# Patient Record
Sex: Female | Born: 1955 | Race: White | Hispanic: No | Smoking: Never smoker
Health system: Southern US, Community
[De-identification: ages and names within clinical notes are randomized; demographics above are authoritative.]

## PROBLEM LIST (undated history)

## (undated) DIAGNOSIS — M1990 Unspecified osteoarthritis, unspecified site: Secondary | ICD-10-CM

## (undated) DIAGNOSIS — K219 Gastro-esophageal reflux disease without esophagitis: Secondary | ICD-10-CM

## (undated) DIAGNOSIS — G4730 Sleep apnea, unspecified: Secondary | ICD-10-CM

## (undated) DIAGNOSIS — M797 Fibromyalgia: Secondary | ICD-10-CM

## (undated) DIAGNOSIS — I10 Essential (primary) hypertension: Secondary | ICD-10-CM

## (undated) DIAGNOSIS — F419 Anxiety disorder, unspecified: Secondary | ICD-10-CM

## (undated) DIAGNOSIS — R42 Dizziness and giddiness: Secondary | ICD-10-CM

## (undated) DIAGNOSIS — R519 Headache, unspecified: Secondary | ICD-10-CM

## (undated) DIAGNOSIS — R51 Headache: Secondary | ICD-10-CM

## (undated) DIAGNOSIS — D649 Anemia, unspecified: Secondary | ICD-10-CM

## (undated) DIAGNOSIS — R7303 Prediabetes: Secondary | ICD-10-CM

## (undated) DIAGNOSIS — F329 Major depressive disorder, single episode, unspecified: Secondary | ICD-10-CM

## (undated) DIAGNOSIS — I493 Ventricular premature depolarization: Secondary | ICD-10-CM

## (undated) DIAGNOSIS — F32A Depression, unspecified: Secondary | ICD-10-CM

## (undated) DIAGNOSIS — G250 Essential tremor: Secondary | ICD-10-CM

## (undated) HISTORY — PX: ROTATOR CUFF REPAIR: SHX139

## (undated) HISTORY — PX: OTHER SURGICAL HISTORY: SHX169

## (undated) HISTORY — PX: ELBOW SURGERY: SHX618

## (undated) HISTORY — PX: CARPAL TUNNEL RELEASE: SHX101

## (undated) HISTORY — PX: KNEE SURGERY: SHX244

---

## 1997-06-12 ENCOUNTER — Other Ambulatory Visit: Admission: RE | Admit: 1997-06-12 | Discharge: 1997-06-12 | Payer: Self-pay | Admitting: Gynecology

## 1997-06-15 ENCOUNTER — Ambulatory Visit (HOSPITAL_COMMUNITY): Admission: RE | Admit: 1997-06-15 | Discharge: 1997-06-15 | Payer: Self-pay | Admitting: Gynecology

## 1997-09-01 ENCOUNTER — Emergency Department (HOSPITAL_COMMUNITY): Admission: EM | Admit: 1997-09-01 | Discharge: 1997-09-01 | Payer: Self-pay | Admitting: Emergency Medicine

## 1998-08-20 ENCOUNTER — Other Ambulatory Visit: Admission: RE | Admit: 1998-08-20 | Discharge: 1998-08-20 | Payer: Self-pay | Admitting: Gynecology

## 1998-09-11 ENCOUNTER — Ambulatory Visit (HOSPITAL_COMMUNITY): Admission: RE | Admit: 1998-09-11 | Discharge: 1998-09-11 | Payer: Self-pay | Admitting: Specialist

## 1998-09-11 ENCOUNTER — Encounter: Payer: Self-pay | Admitting: Specialist

## 1998-09-23 ENCOUNTER — Encounter: Payer: Self-pay | Admitting: Specialist

## 1998-09-23 ENCOUNTER — Ambulatory Visit (HOSPITAL_COMMUNITY): Admission: RE | Admit: 1998-09-23 | Discharge: 1998-09-23 | Payer: Self-pay | Admitting: Specialist

## 1998-10-08 ENCOUNTER — Encounter: Admission: RE | Admit: 1998-10-08 | Discharge: 1999-01-06 | Payer: Self-pay | Admitting: Anesthesiology

## 1998-12-02 ENCOUNTER — Ambulatory Visit (HOSPITAL_COMMUNITY): Admission: RE | Admit: 1998-12-02 | Discharge: 1998-12-02 | Payer: Self-pay | Admitting: Neurosurgery

## 1998-12-17 ENCOUNTER — Encounter: Payer: Self-pay | Admitting: Neurosurgery

## 1998-12-17 ENCOUNTER — Ambulatory Visit (HOSPITAL_COMMUNITY): Admission: RE | Admit: 1998-12-17 | Discharge: 1998-12-17 | Payer: Self-pay | Admitting: Neurosurgery

## 1998-12-31 ENCOUNTER — Encounter: Payer: Self-pay | Admitting: Neurosurgery

## 1998-12-31 ENCOUNTER — Ambulatory Visit (HOSPITAL_COMMUNITY): Admission: RE | Admit: 1998-12-31 | Discharge: 1998-12-31 | Payer: Self-pay | Admitting: Neurosurgery

## 1999-10-20 ENCOUNTER — Other Ambulatory Visit: Admission: RE | Admit: 1999-10-20 | Discharge: 1999-10-20 | Payer: Self-pay | Admitting: Gynecology

## 2000-11-10 ENCOUNTER — Other Ambulatory Visit: Admission: RE | Admit: 2000-11-10 | Discharge: 2000-11-10 | Payer: Self-pay | Admitting: Gynecology

## 2001-01-20 ENCOUNTER — Ambulatory Visit (HOSPITAL_COMMUNITY): Admission: RE | Admit: 2001-01-20 | Discharge: 2001-01-20 | Payer: Self-pay | Admitting: Neurosurgery

## 2001-01-20 ENCOUNTER — Encounter: Payer: Self-pay | Admitting: Neurosurgery

## 2002-01-27 ENCOUNTER — Other Ambulatory Visit: Admission: RE | Admit: 2002-01-27 | Discharge: 2002-01-27 | Payer: Self-pay | Admitting: *Deleted

## 2002-11-28 ENCOUNTER — Emergency Department (HOSPITAL_COMMUNITY): Admission: EM | Admit: 2002-11-28 | Discharge: 2002-11-28 | Payer: Self-pay | Admitting: Emergency Medicine

## 2003-01-06 ENCOUNTER — Emergency Department (HOSPITAL_COMMUNITY): Admission: EM | Admit: 2003-01-06 | Discharge: 2003-01-06 | Payer: Self-pay | Admitting: Emergency Medicine

## 2003-03-11 ENCOUNTER — Emergency Department (HOSPITAL_COMMUNITY): Admission: EM | Admit: 2003-03-11 | Discharge: 2003-03-11 | Payer: Self-pay | Admitting: Emergency Medicine

## 2003-05-16 ENCOUNTER — Ambulatory Visit (HOSPITAL_COMMUNITY): Admission: RE | Admit: 2003-05-16 | Discharge: 2003-05-16 | Payer: Self-pay | Admitting: Family Medicine

## 2003-05-31 ENCOUNTER — Encounter: Admission: RE | Admit: 2003-05-31 | Discharge: 2003-05-31 | Payer: Self-pay | Admitting: Family Medicine

## 2003-06-01 ENCOUNTER — Ambulatory Visit (HOSPITAL_COMMUNITY): Admission: RE | Admit: 2003-06-01 | Discharge: 2003-06-01 | Payer: Self-pay | Admitting: Family Medicine

## 2003-07-27 ENCOUNTER — Encounter: Admission: RE | Admit: 2003-07-27 | Discharge: 2003-07-27 | Payer: Self-pay | Admitting: Family Medicine

## 2003-10-09 ENCOUNTER — Other Ambulatory Visit: Admission: RE | Admit: 2003-10-09 | Discharge: 2003-10-09 | Payer: Self-pay | Admitting: Family Medicine

## 2004-01-08 ENCOUNTER — Ambulatory Visit (HOSPITAL_COMMUNITY): Admission: RE | Admit: 2004-01-08 | Discharge: 2004-01-08 | Payer: Self-pay | Admitting: Orthopedic Surgery

## 2004-05-29 ENCOUNTER — Encounter: Admission: RE | Admit: 2004-05-29 | Discharge: 2004-05-29 | Payer: Self-pay | Admitting: Family Medicine

## 2004-07-04 ENCOUNTER — Encounter: Admission: RE | Admit: 2004-07-04 | Discharge: 2004-07-04 | Payer: Self-pay | Admitting: Orthopaedic Surgery

## 2004-07-29 ENCOUNTER — Encounter: Admission: RE | Admit: 2004-07-29 | Discharge: 2004-07-29 | Payer: Self-pay | Admitting: Orthopaedic Surgery

## 2004-08-20 ENCOUNTER — Encounter: Admission: RE | Admit: 2004-08-20 | Discharge: 2004-08-20 | Payer: Self-pay | Admitting: Orthopaedic Surgery

## 2004-09-03 ENCOUNTER — Encounter: Admission: RE | Admit: 2004-09-03 | Discharge: 2004-09-03 | Payer: Self-pay | Admitting: Orthopaedic Surgery

## 2004-09-08 ENCOUNTER — Emergency Department (HOSPITAL_COMMUNITY): Admission: EM | Admit: 2004-09-08 | Discharge: 2004-09-09 | Payer: Self-pay | Admitting: Emergency Medicine

## 2004-10-21 ENCOUNTER — Other Ambulatory Visit: Admission: RE | Admit: 2004-10-21 | Discharge: 2004-10-21 | Payer: Self-pay | Admitting: Family Medicine

## 2004-10-28 ENCOUNTER — Inpatient Hospital Stay (HOSPITAL_COMMUNITY): Admission: EM | Admit: 2004-10-28 | Discharge: 2004-10-29 | Payer: Self-pay | Admitting: Emergency Medicine

## 2004-12-18 ENCOUNTER — Emergency Department (HOSPITAL_COMMUNITY): Admission: EM | Admit: 2004-12-18 | Discharge: 2004-12-18 | Payer: Self-pay | Admitting: Emergency Medicine

## 2004-12-28 ENCOUNTER — Encounter: Admission: RE | Admit: 2004-12-28 | Discharge: 2004-12-28 | Payer: Self-pay | Admitting: Orthopaedic Surgery

## 2005-06-29 ENCOUNTER — Encounter: Admission: RE | Admit: 2005-06-29 | Discharge: 2005-06-29 | Payer: Self-pay | Admitting: Family Medicine

## 2005-11-06 ENCOUNTER — Other Ambulatory Visit: Admission: RE | Admit: 2005-11-06 | Discharge: 2005-11-06 | Payer: Self-pay | Admitting: Family Medicine

## 2006-04-09 ENCOUNTER — Ambulatory Visit (HOSPITAL_COMMUNITY): Admission: RE | Admit: 2006-04-09 | Discharge: 2006-04-09 | Payer: Self-pay | Admitting: Neurological Surgery

## 2006-11-05 ENCOUNTER — Ambulatory Visit (HOSPITAL_COMMUNITY): Admission: RE | Admit: 2006-11-05 | Discharge: 2006-11-05 | Payer: Self-pay

## 2007-09-07 ENCOUNTER — Encounter
Admission: RE | Admit: 2007-09-07 | Discharge: 2007-10-04 | Payer: Self-pay | Admitting: Physical Medicine and Rehabilitation

## 2007-10-12 ENCOUNTER — Other Ambulatory Visit: Admission: RE | Admit: 2007-10-12 | Discharge: 2007-10-12 | Payer: Self-pay | Admitting: Family Medicine

## 2007-11-08 ENCOUNTER — Encounter: Admission: RE | Admit: 2007-11-08 | Discharge: 2007-11-08 | Payer: Self-pay | Admitting: Family Medicine

## 2008-05-22 ENCOUNTER — Encounter: Admission: RE | Admit: 2008-05-22 | Discharge: 2008-05-22 | Payer: Self-pay | Admitting: Family Medicine

## 2009-09-10 ENCOUNTER — Other Ambulatory Visit: Admission: RE | Admit: 2009-09-10 | Discharge: 2009-09-10 | Payer: Self-pay | Admitting: Family Medicine

## 2009-10-30 ENCOUNTER — Encounter: Admission: RE | Admit: 2009-10-30 | Discharge: 2009-10-30 | Payer: Self-pay | Admitting: Family Medicine

## 2010-05-30 NOTE — Discharge Summary (Signed)
NAME:  Alicia Klein, Alicia Klein NO.:  [id number]   MEDICAL RECORD NO.:  [id number]          PATIENT TYPE:  INP   LOCATION:  3707                         FACILITY:  [facility]   PHYSICIAN:  Corky Crafts, MDDATE OF BIRTH:  Dec 08, 1955   DATE OF ADMISSION:  10/28/2004  DATE OF DISCHARGE:  10/29/2004                                 DISCHARGE SUMMARY   CHIEF COMPLAINT/REASON FOR ADMISSION:  Alicia Klein is a 55 year old  morbidly obese female patient who initially saw Dr. Eldridge Dace on October 16  because of symptomatic PVCs.  During that visit, a 2D echocardiogram was  ordered to assist LV function.  She had also been complaining of shortness  of breath, and a sleep study had been ordered to rule out sleep apnea.  She  had some problems related to fatigue and feeling bad, which Dr. Eldridge Dace  attributed to depression.  She presented to the office on the date of  admission because of worsening shortness of breath this morning, along with  profound fatigue, to the point where she can barely stand up without feeling  like she is going to pass out.  She is also complaining of increasing  palpitations as well as mild chest tightness.   Vital signs were checked on the patient, and she was found to be profoundly  orthostatic.  Supine blood pressure 120/80 with a standing blood pressure of  82/62.  She was given 1000 cc of fluids in the office without improvement in  her symptoms.  Her potassium had decreased from 3.1 to 2.8 after  administration of IV fluids, so she has subsequently been admitted to the  hospital for volume depletion with associated symptomatic orthostasis and  hypokalemia.   HOSPITAL COURSE:  1.  Symptomatic orthostasis and hypokalemia:  Patient was admitted to the      telemetry unit, where she was started on IV fluids with IV potassium as      well as she was given multiple doses of oral potassium repletion.  By      the morning of October 18, her potassium had  finally increased to 3.4,      sodium 140, BUN 13, creatinine 0.9, magnesium 1.9.  Because of her      history of palpitations, a TSH was checked at 1.859.  Because of her      chest pain, cardiac isoenzymes were checked.  These were negative.  Her      EKG was negative.  Because of the chest pain, a CT of the chest was      checked to rule out PE.  There was no PE, but there was cholelithiasis.      In further discussion with the patient, she has been troubled recently      by increasing indigestion and reflux symptoms.  By the morning of      discharge, the patient was finally able to ambulate without any      dizziness, weakness, or shortness of breath.  Orthostatic vital signs      were checked by 3:00 in the afternoon.  Supine 109/62 with a  heart rate      of 75.  Sitting was 117/66.  Heart rate 77 and standing was 121/71 with      a heart rate of 77.  Because she is still somewhat normotensive, her      Toprol, which is normally used for hypertension as well as suppression      of symptomatic PVCs has been placed on hold.  Because she is still      somewhat hypokalemic, Dr. Eldridge Dace wishes to continue potassium as      previous with follow-up lab work at our office.  The patient is very      troubled by issues related to edema in her legs, noting that she is      morbidly obese in the past.  She has had significant leg edema which has      caused her shoes not to fit, but this was later discovered to be related      to the use of verapamil.  During this time period, the patient was      started on Bumex.  Suspect the Bumex is what contributed highly to the      patient's significant diuresis and subsequent symptomatic hypotension.      Again, Dr. Eldridge Dace is recommending the patient not continue this potent      diuretic and instead use Dyazide diuretic after he re-evaluates her in      the office.  I did talk with the patient about the best way to assess      edema in her.  Again,  she is over 300 pounds, so is unable to use a home      scale.  I have instructed her to make attempts to check for pitting on      her lower extremities by putting her thumb directly the anterior tibia      and mashing it.  If an actual thumb print is left, this may indicate      need for diuretics.  This will further be discussed with her and      additional ways to assess for edema will be discussed between her and      Dr. Eldridge Dace at followup.   FINAL DISCHARGE DIAGNOSES:  1.  Orthostatic hypotension, symptomatic, secondary to volume depletion due      to use of potent loop diuretic.  2.  Palpitations with history of frequent premature ventricular      contractions.  3.  Hypotension.  4.  Hypokalemia,  greatly improved.  5.  Morbid obesity.  6.  Osteoarthritis.  7.  Probable depression.   DISCHARGE MEDICATIONS:  1.  Stop Bumex.  2.  Celexa 40 mg daily.  3.  Toprol XL 50 mg daily.  This is to be held until the patient follows up      with Dr. Eldridge Dace.  4.  Potassium tablets 10 mEq 2 daily.  5.  Hydrochlorothiazide 25 mg tablets p.r.n. edema.  Again, the patient is      not to take this until after she has been seen by Dr. Eldridge Dace at the      office.   DIET:  Heart healthy.   ACTIVITY:  Increase activity slowly.  No driving for at least 24 hours.  Return to work on Monday, November 03, 2004.   FOLLOW-UP APPOINTMENTS:  She is to see Dr. Eldridge Dace on October 25th at 1  p.m.  She is to arrive to the office at 1:30  for a STAT BMET.      Alicia Klein, N.P.    ______________________________  Corky Crafts, MD    ALE/MEDQ  D:  10/29/2004  T:  10/29/2004  Job:  [id number]   cc:   Molly Maduro A. Nicholos Johns, M.D.  Fax: [phone]

## 2010-05-30 NOTE — Op Note (Signed)
NAME:  Alicia Klein, GASIOROWSKI             ACCOUNT NO.:  [id number]   MEDICAL RECORD NO.:  [id number]          PATIENT TYPE:  AMB   LOCATION:  SDS                          FACILITY:  [facility]   PHYSICIAN:  Tia Alert, MD     DATE OF BIRTH:  22-Aug-1955   DATE OF PROCEDURE:  04/09/2006  DATE OF DISCHARGE:                               OPERATIVE REPORT   PREOPERATIVE DIAGNOSES:  Right carpal tunnel syndrome.   POSTOPERATIVE DIAGNOSES:  Right carpal tunnel syndrome.   PROCEDURE:  Right carpal tunnel release.   SURGEON:  Dr. Marikay Alar.   ANESTHESIA:  Loca MAC.   COMPLICATIONS:  None apparent.   INDICATIONS FOR PROCEDURE:  [name] is a 55 year old female who had  severe right hand numbness.  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  This matched  her symptoms.  She tried bracing without significant relief.  Recommended a right carpal tunnel release.  She understood the risks,  benefits and expected outcome and wished to proceed.   DESCRIPTION OF PROCEDURE:  The patient was taken to the operating room  and after conduction of adequate generalized endotracheal anesthesia,  she was placed in the supine position on the operating table.  Her right  anterior cervical region was prepped circumferentially with DuraPrep and  then draped in the usual sterile fashion.  10 cc of local anesthesia was  injected and the small palmar incision was made from the distal wrist  crease into the palm in line with the web space between the 3rd and 4th  digits.  I dissected down through the palmar fat and fascia, coagulated  the fat, placed a self-retaining retractor, identified the transcarpal  ligament and opened it with a 15 blade scalpel until there was about a 3  mm opening, identifying the median nerve below.  I then spread between  the median nerve and the transverse carpal ligament distally into the  palm utilizing a curved hemostat and then  transected the transverse  carpal ligament distally into the palm until the palmar fat was noticed.  I then palpated with a hemostat or mosquito to make sure that I had a  complete transection of the transverse carpal ligament distally into the  palm.  I then did the same procedure, spread in between the nerve and  the ligament more proximally under the wrist crease until the transverse  carpal ligament was completely transected, more proximally into the  wrist.  I then palpated with a curved hemostat to in assure this was  completely transected.  I then identified the nerve, inspected the nerve  and it was looked to be healthy.  I then irrigated with Saline solution  containing Bacitracin. I  found all bleeding points with bipolar cautery  and then closed the palmar fascia with a single 3-0 Vicryl suture,  closed the subcuticular tissues with 3-0 Vicryl sutures and then closed  the skin with interrupted 4-0 Ethilon vertical mattress  sutures.  The hand was then wrapped in Kerlix and an Ace bandage.  Then  the patient  was transported to the recovery room in stable condition.  At the end of the procedure, all sponge, needle and instrument counts  were correct.      Tia Alert, MD  Electronically Signed     DSJ/MEDQ  D:  04/09/2006  T:  04/09/2006  Job:  [id number]

## 2010-10-27 ENCOUNTER — Other Ambulatory Visit: Payer: Self-pay | Admitting: Family Medicine

## 2010-10-27 DIAGNOSIS — Z1231 Encounter for screening mammogram for malignant neoplasm of breast: Secondary | ICD-10-CM

## 2010-11-17 ENCOUNTER — Ambulatory Visit: Payer: Self-pay

## 2010-11-20 ENCOUNTER — Ambulatory Visit
Admission: RE | Admit: 2010-11-20 | Discharge: 2010-11-20 | Disposition: A | Discharge status: Home / Self Care | Payer: Medicare Other | Source: Ambulatory Visit | Attending: Family Medicine | Admitting: Family Medicine

## 2010-11-20 DIAGNOSIS — Z1231 Encounter for screening mammogram for malignant neoplasm of breast: Secondary | ICD-10-CM

## 2011-02-20 ENCOUNTER — Other Ambulatory Visit: Payer: Self-pay | Admitting: Neurology

## 2011-02-20 DIAGNOSIS — G44009 Cluster headache syndrome, unspecified, not intractable: Secondary | ICD-10-CM

## 2011-02-20 DIAGNOSIS — G4730 Sleep apnea, unspecified: Secondary | ICD-10-CM

## 2011-02-20 DIAGNOSIS — R413 Other amnesia: Secondary | ICD-10-CM

## 2011-02-27 ENCOUNTER — Ambulatory Visit
Admission: RE | Admit: 2011-02-27 | Discharge: 2011-02-27 | Disposition: A | Discharge status: Home / Self Care | Payer: Medicare Other | Source: Ambulatory Visit | Attending: Neurology | Admitting: Neurology

## 2011-02-27 DIAGNOSIS — R413 Other amnesia: Secondary | ICD-10-CM

## 2011-02-27 DIAGNOSIS — G4730 Sleep apnea, unspecified: Secondary | ICD-10-CM

## 2011-02-27 DIAGNOSIS — G44009 Cluster headache syndrome, unspecified, not intractable: Secondary | ICD-10-CM

## 2011-10-07 ENCOUNTER — Other Ambulatory Visit: Payer: Self-pay | Admitting: Family Medicine

## 2011-10-07 DIAGNOSIS — Z1231 Encounter for screening mammogram for malignant neoplasm of breast: Secondary | ICD-10-CM

## 2011-11-23 ENCOUNTER — Ambulatory Visit
Admission: RE | Admit: 2011-11-23 | Discharge: 2011-11-23 | Disposition: A | Discharge status: Home / Self Care | Payer: Medicare Other | Source: Ambulatory Visit | Attending: Family Medicine | Admitting: Family Medicine

## 2011-11-23 DIAGNOSIS — Z1231 Encounter for screening mammogram for malignant neoplasm of breast: Secondary | ICD-10-CM

## 2012-04-26 ENCOUNTER — Other Ambulatory Visit: Payer: Self-pay | Admitting: Family Medicine

## 2012-04-26 ENCOUNTER — Other Ambulatory Visit (HOSPITAL_COMMUNITY)
Admission: RE | Admit: 2012-04-26 | Discharge: 2012-04-26 | Disposition: A | Discharge status: Home / Self Care | Payer: Medicare Other | Source: Ambulatory Visit | Attending: Family Medicine | Admitting: Family Medicine

## 2012-04-26 DIAGNOSIS — Z124 Encounter for screening for malignant neoplasm of cervix: Secondary | ICD-10-CM | POA: Insufficient documentation

## 2012-05-11 ENCOUNTER — Ambulatory Visit: Payer: Self-pay | Admitting: Nurse Practitioner

## 2012-05-21 ENCOUNTER — Encounter (HOSPITAL_COMMUNITY): Payer: Self-pay | Admitting: Emergency Medicine

## 2012-05-21 ENCOUNTER — Emergency Department (HOSPITAL_COMMUNITY)
Admission: EM | Admit: 2012-05-21 | Discharge: 2012-05-21 | Disposition: A | Discharge status: Home / Self Care | Payer: Medicare Other | Attending: Emergency Medicine | Admitting: Emergency Medicine

## 2012-05-21 DIAGNOSIS — M545 Low back pain, unspecified: Secondary | ICD-10-CM | POA: Insufficient documentation

## 2012-05-21 DIAGNOSIS — IMO0001 Reserved for inherently not codable concepts without codable children: Secondary | ICD-10-CM | POA: Insufficient documentation

## 2012-05-21 DIAGNOSIS — M129 Arthropathy, unspecified: Secondary | ICD-10-CM | POA: Insufficient documentation

## 2012-05-21 DIAGNOSIS — R209 Unspecified disturbances of skin sensation: Secondary | ICD-10-CM | POA: Insufficient documentation

## 2012-05-21 DIAGNOSIS — M549 Dorsalgia, unspecified: Secondary | ICD-10-CM

## 2012-05-21 DIAGNOSIS — Z79899 Other long term (current) drug therapy: Secondary | ICD-10-CM | POA: Insufficient documentation

## 2012-05-21 DIAGNOSIS — Z880 Allergy status to penicillin: Secondary | ICD-10-CM | POA: Insufficient documentation

## 2012-05-21 DIAGNOSIS — G8929 Other chronic pain: Secondary | ICD-10-CM | POA: Insufficient documentation

## 2012-05-21 DIAGNOSIS — Z7982 Long term (current) use of aspirin: Secondary | ICD-10-CM | POA: Insufficient documentation

## 2012-05-21 HISTORY — DX: Fibromyalgia: M79.7

## 2012-05-21 HISTORY — DX: Unspecified osteoarthritis, unspecified site: M19.90

## 2012-05-21 MED ORDER — HYDROMORPHONE HCL PF 1 MG/ML IJ SOLN
1.0000 mg | Freq: Once | INTRAMUSCULAR | Status: AC
  Administered 2012-05-21: 1 mg via INTRAMUSCULAR
  Filled 2012-05-21: qty 1

## 2012-05-21 MED ORDER — ONDANSETRON 4 MG PO TBDP
8.0000 mg | ORAL_TABLET | Freq: Once | ORAL | Status: AC
  Administered 2012-05-21: 8 mg via ORAL
  Filled 2012-05-21: qty 2

## 2012-05-21 NOTE — ED Notes (Signed)
Pt presents to ED today with c/o acute on chronic back pain. Pt states she took hydrocodone at 4 am and ice on her lower back without any relief. Patient states this pain is worse than her normal back pain. Pt diaphoretic  in triage.

## 2012-05-21 NOTE — ED Provider Notes (Signed)
History     CSN: [id number]  Arrival date & time 05/21/12  1051   First MD Initiated Contact with Patient 05/21/12 1102     Chief Complaint  Patient presents with  . Back Pain   (Consider location/radiation/quality/duration/timing/severity/associated sxs/prior treatment) HPI Comments: Ms. Madore is a 57 year old morbidly obese female with PMH of fibromyalgia and chronic back pain (followed closely at pain clinic) and s/p multiple epidural injections in the past presenting to the ED today for worsening lower back pain x3 weeks.  Ms. Seiple has chronic diffuse body pain with increased back pain.  She is seen at the pain clinic and is on lyrica and vicodin at home which have not provided much relief recently.  She was scheduled to get a RFA SI joint injection in April 2014, however, this was rescheduled by provider for May 2014 and now pushed back even further due to insurance approval issues.  She has not been able to be seen by them for this worsening back pain for the past three weeks.  The pain is 10/10, more right sided, diffuse tenderness but more point tenderness to mid lower back and radiating down right buttock, feels like a deep aching pain, and this morning she said was the worst to the point that she was in tears.  She did not have anyone with her at home so she had to drive herself here, which was extremely painful.  She took 1 tablet of Vicodin this morning to no relief.  Over the course of the past 3 weeks, she has tried ice and heat to the area, lyrica, and taking vicodin up to three times a day with minimal relief.  She denise any recent trauma to the area, accident, fall, twisting motion, urinary complaints, chest pain, shortness of breath, fever, chills, N/V/D, or any weakness.  She reports occasional tingling in her fingers that she says is chronic and as a result of her fibromyalgia.  She does endorse that her legs occasionally give out on her and she trips.  Her last actual fall  was in February 2014.    Of note MRI 12/2004 was significant for scoliosis of the spine convex to the right with the apex at about T-5.  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  Right posterolateral disc herniation at T10-11 that indents the thecal sac but does not grossly affect the cord. This does extend towards the foramen on the right but not grossly into it.      Patient is a 57 y.o. female presenting with back pain. The history is provided by the patient. No language interpreter was used.  Back Pain Location:  Generalized (point tendernss to mid lower spine) Quality:  Aching Pain severity:  Severe Pain is:  Same all the time Onset quality:  Gradual Duration:  3 weeks Timing:  Constant Progression:  Worsening Chronicity:  Chronic Context: not jumping from heights, not lifting heavy objects, not recent injury and not twisting   Context comment:  Trips often Relieved by:  Nothing (mild improvement with opiates) Worsened by:  Movement and sitting Ineffective treatments:  Cold packs, heating pad, bed rest, being still, lying down and narcotics Risk factors: obesity     Past Medical History  Diagnosis Date  . Fibromyalgia   . Arthritis     Past Surgical History  Procedure Laterality Date  . Carpal tunnel release      both  wrists  . Knee surgery      right knee    History reviewed. No pertinent family history.  History  Substance Use Topics  . Smoking status: Not on file  . Smokeless tobacco: Not on file  . Alcohol Use: Not on file    OB History   Grav Para Term Preterm Abortions TAB SAB Ect Mult Living                  Review of Systems  Constitutional: Negative.        Morbidly obese  HENT: Negative.   Eyes: Negative.   Respiratory: Negative.   Cardiovascular: Negative.   Gastrointestinal: Negative.   Endocrine: Negative.   Genitourinary: Negative.   Musculoskeletal:  Positive for back pain.       Chronic back pain  Skin: Negative.   Neurological: Negative.   Hematological: Negative.   Psychiatric/Behavioral: Negative.     Allergies  Sulfa antibiotics; Amoxicillin; Garlic; Onion; and Percocet  Home Medications   Current Outpatient Rx  Name  Route  Sig  Dispense  Refill  . aspirin EC 81 MG tablet   Oral   Take 81 mg by mouth daily.         . B Complex Vitamins (B COMPLEX PO)   Sublingual   Place 1 mL under the tongue daily.          . Cholecalciferol 4000 UNITS CAPS   Oral   Take 1 capsule by mouth at bedtime.          . citalopram (CELEXA) 40 MG tablet   Oral   Take 40 mg by mouth at bedtime.          Marland Kitchen etodolac (LODINE) 500 MG tablet   Oral   Take 500 mg by mouth daily.         . furosemide (LASIX) 80 MG tablet   Oral   Take 80 mg by mouth daily.         Marland Kitchen HYDROcodone-acetaminophen (NORCO/VICODIN) 5-325 MG per tablet   Oral   Take 2-3 tablets by mouth every 6 (six) hours as needed for pain.         . metoprolol succinate (TOPROL-XL) 100 MG 24 hr tablet   Oral   Take 100 mg by mouth daily. Take with or immediately following a meal.         . Multiple Vitamins-Minerals (MULTIVITAMINS THER. W/MINERALS) TABS   Oral   Take 1 tablet by mouth daily.         . potassium chloride (K-DUR) 10 MEQ tablet   Oral   Take 10 mEq by mouth daily.         . pregabalin (LYRICA) 75 MG capsule   Oral   Take 75-150 mg by mouth 2 (two) times daily. 75 mg in morning and 150 mg at night         . ranitidine (ZANTAC) 150 MG tablet   Oral   Take 150 mg by mouth 2 (two) times daily as needed for heartburn (acid reflux).         . Red Yeast Rice 600 MG CAPS   Oral   Take 600 mg by mouth 2 (two) times daily.          Marland Kitchen topiramate (TOPAMAX) 50 MG tablet   Oral   Take 150 mg by mouth at bedtime. Migraines         . baclofen (LIORESAL) 10 MG tablet  Oral   Take 10 mg by mouth 2 (two) times daily as needed  (headache). Limited to 2 episodes per week           BP 137/74  Pulse 59  Temp(Src) 98.2 F (36.8 C) (Oral)  Resp 16  SpO2 96%  Physical Exam  Constitutional: She is oriented to person, place, and time.  Morbidly obese  HENT:  Head: Normocephalic and atraumatic.  Eyes: Conjunctivae and EOM are normal. Pupils are equal, round, and reactive to light.  Neck: Normal range of motion.  Cardiovascular: Normal rate, regular rhythm, normal heart sounds and intact distal pulses.   Distant heart sounds due to body habitus  Pulmonary/Chest: Effort normal and breath sounds normal.  Abdominal: Soft. Bowel sounds are normal. She exhibits no distension. There is no tenderness.  obese  Musculoskeletal: She exhibits tenderness.  Diffuse tenderness to palpation with hx of fibromyalgia.  Limited ability to lift b/l lower extremities due to complaints of lower back pain.  Able to sit up and walk with cane but painful. Point tenderness to deep palpation of mid lower spine at hip line, worse on right than left.    Neurological: She is alert and oriented to person, place, and time. No cranial nerve deficit.  Strength 4/5 equal b/l upper and lower extremities, seems effort dependent.  Sensation grossly intact.   Skin: Skin is warm and dry.  Psychiatric: She has a normal mood and affect. Her behavior is normal. Judgment and thought content normal.    ED Course  Procedures (including critical care time)  Labs Reviewed - No data to display No results found.   1. Chronic back pain     MDM  Ms. Critz is a 57 year old morbidly obese female with fibromyalgia and chronic back pain presenting with worsening lower back pain x3 weeks.    -1mg  IM Dilaudid injection pain control -follow up advised with pain clinic and pcp as soon as possible -d/c home  Case discussed with Dr. Liston Alba, MD 05/22/12 [phone]

## 2012-05-21 NOTE — ED Notes (Signed)
C/o lower back pain x 3 weeks. Denies injury. Has chronic low back pain currently goes to pain clinic to manage. But reports this pain is lower & worse than normal. Pain R>L. States unable to get appt with pain clinic due to insurance problems.

## 2012-05-24 NOTE — ED Provider Notes (Signed)
I saw and evaluated the patient, reviewed the resident's note and I agree with the findings and plan. Pt c/o chronic low back pain. Denies injury. No gu c/o. No fever. Spine nt. No shingles/rash or sts  To area of pain.   Suzi Roots, MD 05/24/12 1059

## 2012-12-07 ENCOUNTER — Other Ambulatory Visit: Payer: Self-pay

## 2012-12-07 DIAGNOSIS — Z1231 Encounter for screening mammogram for malignant neoplasm of breast: Secondary | ICD-10-CM

## 2012-12-31 ENCOUNTER — Encounter (HOSPITAL_COMMUNITY): Payer: Self-pay | Admitting: Emergency Medicine

## 2012-12-31 ENCOUNTER — Emergency Department (HOSPITAL_COMMUNITY)
Admission: EM | Admit: 2012-12-31 | Discharge: 2012-12-31 | Disposition: A | Discharge status: Home / Self Care | Payer: Medicare Other | Attending: Emergency Medicine | Admitting: Emergency Medicine

## 2012-12-31 DIAGNOSIS — Z7982 Long term (current) use of aspirin: Secondary | ICD-10-CM | POA: Insufficient documentation

## 2012-12-31 DIAGNOSIS — R42 Dizziness and giddiness: Secondary | ICD-10-CM | POA: Insufficient documentation

## 2012-12-31 DIAGNOSIS — Z79899 Other long term (current) drug therapy: Secondary | ICD-10-CM | POA: Insufficient documentation

## 2012-12-31 DIAGNOSIS — H81392 Other peripheral vertigo, left ear: Secondary | ICD-10-CM

## 2012-12-31 DIAGNOSIS — M129 Arthropathy, unspecified: Secondary | ICD-10-CM | POA: Insufficient documentation

## 2012-12-31 DIAGNOSIS — H81399 Other peripheral vertigo, unspecified ear: Secondary | ICD-10-CM | POA: Insufficient documentation

## 2012-12-31 DIAGNOSIS — R110 Nausea: Secondary | ICD-10-CM | POA: Insufficient documentation

## 2012-12-31 HISTORY — DX: Dizziness and giddiness: R42

## 2012-12-31 LAB — BASIC METABOLIC PANEL
Calcium: 9.2 mg/dL (ref 8.4–10.5)
GFR calc non Af Amer: 75 mL/min — ABNORMAL LOW (ref >90)
Sodium: 140 mEq/L (ref 135–145)

## 2012-12-31 LAB — CBC WITH DIFFERENTIAL/PLATELET
Basophils Absolute: 0 10*3/uL (ref 0.0–0.1)
Eosinophils Absolute: 0.2 10*3/uL (ref 0.0–0.7)
Eosinophils Relative: 2 % (ref 0–5)
Lymphocytes Relative: 28 % (ref 12–46)
MCH: 30.5 pg (ref 26.0–34.0)
MCV: 90 fL (ref 78.0–100.0)
Platelets: 226 10*3/uL (ref 150–400)
RDW: 13.2 % (ref 11.5–15.5)
WBC: 7.9 10*3/uL (ref 4.0–10.5)

## 2012-12-31 MED ORDER — MECLIZINE HCL 25 MG PO TABS: 25.0000 mg | ORAL_TABLET | Freq: Three times a day (TID) | ORAL | Status: DC | PRN

## 2012-12-31 MED ORDER — PROMETHAZINE HCL 25 MG/ML IJ SOLN
12.5000 mg | Freq: Once | INTRAMUSCULAR | Status: AC
  Administered 2012-12-31: 12.5 mg via INTRAVENOUS
  Filled 2012-12-31 (×2): qty 1

## 2012-12-31 MED ORDER — ONDANSETRON HCL 4 MG PO TABS: 4.0000 mg | ORAL_TABLET | Freq: Four times a day (QID) | ORAL | Status: DC

## 2012-12-31 MED ORDER — SODIUM CHLORIDE 0.9 % IV BOLUS (SEPSIS)
1000.0000 mL | Freq: Once | INTRAVENOUS | Status: AC
  Administered 2012-12-31: 1000 mL via INTRAVENOUS

## 2012-12-31 MED ORDER — MECLIZINE HCL 25 MG PO TABS
25.0000 mg | ORAL_TABLET | Freq: Once | ORAL | Status: AC
  Administered 2012-12-31: 25 mg via ORAL
  Filled 2012-12-31: qty 1

## 2012-12-31 NOTE — ED Notes (Signed)
Bed: WU98 Expected date: 12/31/12 Expected time: 8:39 AM Means of arrival: Ambulance Comments: Vertigo

## 2012-12-31 NOTE — ED Notes (Signed)
She c/o dizziness; and "whenever I move my head I get real nauseated".  She is alert and oriented x 4 with clear speech.  Her skin is pale, warm and dry and she is breathing normally.

## 2012-12-31 NOTE — ED Notes (Signed)
She has attempted to phone a relative--no answer--will assist her with call again in a few minutes.

## 2012-12-31 NOTE — ED Notes (Signed)
She was able to phone the person who is her ride home.  She remains in no distress and denies pain/nausea.

## 2012-12-31 NOTE — ED Notes (Signed)
She states she feels better and is drowsy and in no distress.

## 2012-12-31 NOTE — ED Notes (Signed)
Registration calling for cab for pt

## 2012-12-31 NOTE — ED Provider Notes (Signed)
CSN: [id number]     Arrival date & time 12/31/12  3086 History   First MD Initiated Contact with Patient 12/31/12 0848     Chief Complaint  Patient presents with  . Dizziness    Patient is a 57 y.o. female presenting with dizziness.  Dizziness Quality:  Head spinning and room spinning Severity:  Severe Duration:  3 days Progression:  Worsening (yesterday it seemed a little better but started again when she woke up this am) Chronicity:  New Context: bending over and head movement   Relieved by:  Being still Worsened by:  Movement and turning head (certain movements make it worse) Ineffective treatments:  None tried Associated symptoms: nausea   Associated symptoms: no headaches, no hearing loss, no shortness of breath, no syncope, no tinnitus, no vomiting and no weakness   Associated symptoms comment:  No trouble with coordination or balance  Risk factors: hx of vertigo     Past Medical History  Diagnosis Date  . Fibromyalgia   . Arthritis   . Vertigo    Past Surgical History  Procedure Laterality Date  . Carpal tunnel release      both wrists  . Knee surgery      right knee   History reviewed. No pertinent family history. History  Substance Use Topics  . Smoking status: Never Smoker   . Smokeless tobacco: Not on file  . Alcohol Use: Not on file   OB History   Grav Para Term Preterm Abortions TAB SAB Ect Mult Living                 Review of Systems  HENT: Negative for hearing loss and tinnitus.   Respiratory: Negative for shortness of breath.   Cardiovascular: Negative for syncope.  Gastrointestinal: Positive for nausea. Negative for vomiting.  Neurological: Positive for dizziness. Negative for headaches.  All other systems reviewed and are negative.    Allergies  Sulfa antibiotics; Amoxicillin; Garlic; Onion; and Percocet  Home Medications   Current Outpatient Rx  Name  Route  Sig  Dispense  Refill  . aspirin EC 81 MG tablet   Oral   Take 81 mg  by mouth daily.         . B Complex Vitamins (B COMPLEX PO)   Sublingual   Place 1 mL under the tongue daily.          . baclofen (LIORESAL) 10 MG tablet   Oral   Take 10 mg by mouth 2 (two) times daily as needed (headache). Limited to 2 episodes per week         . cholecalciferol (VITAMIN D) 1000 UNITS tablet   Oral   Take 4,000 Units by mouth every evening.         . citalopram (CELEXA) 40 MG tablet   Oral   Take 40 mg by mouth at bedtime.          Marland Kitchen etodolac (LODINE) 500 MG tablet   Oral   Take 500 mg by mouth daily.         . furosemide (LASIX) 80 MG tablet   Oral   Take 80 mg by mouth daily.         Marland Kitchen HYDROcodone-acetaminophen (NORCO/VICODIN) 5-325 MG per tablet   Oral   Take 2-3 tablets by mouth every 6 (six) hours as needed for pain.         . metoprolol succinate (TOPROL-XL) 100 MG 24 hr tablet   Oral  Take 100 mg by mouth daily. Take with or immediately following a meal.         . Multiple Vitamins-Minerals (MULTIVITAMINS THER. W/MINERALS) TABS   Oral   Take 1 tablet by mouth daily.         . potassium chloride (K-DUR) 10 MEQ tablet   Oral   Take 10 mEq by mouth daily.         . pregabalin (LYRICA) 75 MG capsule   Oral   Take 75-150 mg by mouth 2 (two) times daily. 75 mg in morning and 150 mg at night         . ranitidine (ZANTAC) 150 MG tablet   Oral   Take 150 mg by mouth 2 (two) times daily.          . Red Yeast Rice 600 MG CAPS   Oral   Take 600 mg by mouth 2 (two) times daily.          Marland Kitchen topiramate (TOPAMAX) 50 MG tablet   Oral   Take 150 mg by mouth at bedtime. Migraines         . meclizine (ANTIVERT) 25 MG tablet   Oral   Take 1 tablet (25 mg total) by mouth 3 (three) times daily as needed for dizziness.   30 tablet   0   . ondansetron (ZOFRAN) 4 MG tablet   Oral   Take 1 tablet (4 mg total) by mouth every 6 (six) hours.   12 tablet   0    BP 119/49  Pulse 59  Temp(Src) 97.5 F (36.4 C) (Oral)   Resp 16  SpO2 95% Physical Exam  Nursing note and vitals reviewed. Constitutional: She is oriented to person, place, and time. She appears well-developed and well-nourished. No distress.  HENT:  Head: Normocephalic and atraumatic.  Right Ear: External ear normal.  Left Ear: External ear normal.  Mouth/Throat: Oropharynx is clear and moist.  Eyes: Conjunctivae are normal. Right eye exhibits no discharge. Left eye exhibits no discharge. No scleral icterus.  Neck: Neck supple. No tracheal deviation present.  Cardiovascular: Normal rate, regular rhythm and intact distal pulses.   Pulmonary/Chest: Effort normal and breath sounds normal. No stridor. No respiratory distress. She has no wheezes. She has no rales.  Abdominal: Soft. Bowel sounds are normal. She exhibits no distension. There is no tenderness. There is no rebound and no guarding.  Musculoskeletal: She exhibits no edema and no tenderness.  Neurological: She is alert and oriented to person, place, and time. She has normal strength. No cranial nerve deficit (No facial droop, extraocular movements intact, tongue midline ) or sensory deficit. She exhibits normal muscle tone. She displays no seizure activity. Coordination normal.  No pronator drift bilateral upper extrem, able to hold both legs off bed for 5 seconds, sensation intact in all extremities, no visual field cuts, no left or right sided neglect, normal finger-nose exam bilaterally, no nystagmus noted Vertigo reproduced with hall pike, turning head to left,   Skin: Skin is warm and dry. No rash noted.  Psychiatric: She has a normal mood and affect.    ED Course  Procedures (including critical care time) Labs Review Labs Reviewed  BASIC METABOLIC PANEL - Abnormal; Notable for the following:    Glucose, Bld 124 (*)    GFR calc non Af Amer 75 (*)    GFR calc Af Amer 87 (*)    All other components within normal limits  CBC WITH DIFFERENTIAL  Imaging Review No results  found.  EKG Interpretation    Date/Time:  Saturday December 31 2012 09:39:39 EST Ventricular Rate:  57 PR Interval:  200 QRS Duration: 88 QT Interval:  458 QTC Calculation: 446 R Axis:   13 Text Interpretation:  Sinus rhythm Low voltage, precordial leads No significant change since last tracing Confirmed by Adilson Grafton  MD-J, Salle Brandle ([id number]) on 12/31/2012 9:50:37 AM            MDM   1. Peripheral vertigo, left    Patient has a normal neurologic exam. I doubt a central nervous system cause of her vertigo. The symptoms seem to be related to a peripheral cause. The patient's symptoms are reproduced with turning her head to the left.  She is not anemic or dehydrated.  Patient is able to walk without difficulty.  At this time there does not appear to be any evidence of an acute emergency medical condition and the patient appears stable for discharge with appropriate outpatient follow up. DC home with prescription for meclizine and zofran.  Warning signs and precautions were discussed   Celene Kras, MD 12/31/12 1022

## 2013-01-17 ENCOUNTER — Ambulatory Visit
Admission: RE | Admit: 2013-01-17 | Discharge: 2013-01-17 | Disposition: A | Discharge status: Home / Self Care | Payer: Medicare Other | Source: Ambulatory Visit

## 2013-01-17 DIAGNOSIS — Z1231 Encounter for screening mammogram for malignant neoplasm of breast: Secondary | ICD-10-CM

## 2013-02-14 ENCOUNTER — Other Ambulatory Visit: Payer: Self-pay | Admitting: Specialist

## 2013-02-14 DIAGNOSIS — H9319 Tinnitus, unspecified ear: Secondary | ICD-10-CM

## 2013-02-14 DIAGNOSIS — R42 Dizziness and giddiness: Secondary | ICD-10-CM

## 2013-02-23 ENCOUNTER — Ambulatory Visit
Admission: RE | Admit: 2013-02-23 | Discharge: 2013-02-23 | Disposition: A | Discharge status: Home / Self Care | Payer: Medicare Other | Source: Ambulatory Visit | Attending: Specialist | Admitting: Specialist

## 2013-02-23 DIAGNOSIS — H9319 Tinnitus, unspecified ear: Secondary | ICD-10-CM

## 2013-02-23 DIAGNOSIS — R42 Dizziness and giddiness: Secondary | ICD-10-CM

## 2013-02-23 MED ORDER — GADOBENATE DIMEGLUMINE 529 MG/ML IV SOLN
20.0000 mL | Freq: Once | INTRAVENOUS | Status: AC | PRN
  Administered 2013-02-23: 20 mL via INTRAVENOUS

## 2013-12-15 ENCOUNTER — Other Ambulatory Visit: Payer: Self-pay

## 2013-12-15 DIAGNOSIS — Z1231 Encounter for screening mammogram for malignant neoplasm of breast: Secondary | ICD-10-CM

## 2014-01-19 ENCOUNTER — Ambulatory Visit
Admission: RE | Admit: 2014-01-19 | Discharge: 2014-01-19 | Disposition: A | Discharge status: Home / Self Care | Payer: Medicare Other | Source: Ambulatory Visit

## 2014-01-19 DIAGNOSIS — Z1231 Encounter for screening mammogram for malignant neoplasm of breast: Secondary | ICD-10-CM

## 2014-10-12 ENCOUNTER — Ambulatory Visit: Payer: Medicare Other | Attending: Physical Medicine and Rehabilitation

## 2014-10-12 DIAGNOSIS — R6889 Other general symptoms and signs: Secondary | ICD-10-CM | POA: Diagnosis present

## 2014-10-12 DIAGNOSIS — R293 Abnormal posture: Secondary | ICD-10-CM | POA: Diagnosis not present

## 2014-10-12 DIAGNOSIS — M436 Torticollis: Secondary | ICD-10-CM | POA: Diagnosis present

## 2014-10-12 DIAGNOSIS — R51 Headache: Secondary | ICD-10-CM | POA: Diagnosis present

## 2014-10-12 DIAGNOSIS — M542 Cervicalgia: Secondary | ICD-10-CM

## 2014-10-12 DIAGNOSIS — R519 Headache, unspecified: Secondary | ICD-10-CM

## 2014-10-12 NOTE — Patient Instructions (Signed)
She was instructed and handout issued on sitting posture and use of supports to back and arms. She was instructed in gently scapula retraction and cervical retraction/elongation  3-x/day 2-3 reps each

## 2014-10-12 NOTE — Therapy (Signed)
[facility] [address] [place], [place], [address] Phone: [phone]   Fax:  [phone]  Physical Therapy Evaluation  Patient Details  Name: Alicia Klein MRN: [id number] Date of Birth: 07-10-1955 Referring Provider:  Callie Fielding, MD  Encounter Date: 10/12/2014      PT End of Session - 10/12/14 1139    Visit Number 1   Number of Visits 12   Date for PT Re-Evaluation 11/23/14   Authorization Type Medicare   Authorization - Visit Number 1   Authorization - Number of Visits 12   PT Start Time 0930   PT Stop Time 1014   PT Time Calculation (min) 44 min   Activity Tolerance Patient tolerated treatment well   Behavior During Therapy [facility] for tasks assessed/performed      Past Medical History  Diagnosis Date  . Fibromyalgia   . Arthritis   . Vertigo     Past Surgical History  Procedure Laterality Date  . Carpal tunnel release      both wrists  . Knee surgery      right knee    There were no vitals filed for this visit.  Visit Diagnosis:  Abnormal posture - Plan: PT plan of care cert/re-cert  Stiffness of neck - Plan: PT plan of care cert/re-cert  Nonintractable episodic headache, unspecified headache type - Plan: PT plan of care cert/re-cert  Pain in neck - Plan: PT plan of care cert/re-cert  Activity intolerance - Plan: PT plan of care cert/re-cert      Subjective Assessment - 10/12/14 0939    Subjective She reports chronic pain and her neck pain has been causing stiffness and popping on LT side and base of skull pain and head aches. Pain can make me nauseous. Less active . I have used heat/ice , was on prednisone all without benefit.  I still do some drawing in short bouts but this gives me pain   Pertinent History Chronic myalgias and fibromyalgia.    Limitations --  limited activity when pain worse in neck and head   Diagnostic tests No   Patient Stated Goals Ease pain so I have less headache and  more activity   Currently in Pain? Yes   Pain Score 8    Pain Location Neck   Pain Orientation Right;Left;Posterior   Pain Descriptors / Indicators Aching   Pain Type Chronic pain   Pain Radiating Towards ilateral posterior headache   Pain Onset More than a month ago   Pain Frequency Constant   Aggravating Factors  As day goes on, anything   Pain Relieving Factors heat/ice lying   Effect of Pain on Daily Activities all activity   Multiple Pain Sites Yes            [facility] PT Assessment - 10/12/14 0936    Assessment   Medical Diagnosis myositis, myalgia   Onset Date/Surgical Date --  chronic but worse in past month   Precautions   Precautions None   Restrictions   Weight Bearing Restrictions No   Balance Screen   Has the patient fallen in the past 6 months Yes   How many times? 1  fell out of bed   Has the patient had a decrease in activity level because of a fear of falling?  No   Is the patient reluctant to leave their home because of a fear of falling?  No   Home [employer] residence  Living Arrangements Alone   Prior Function   Level of Independence Requires assistive device for independence  occasionally gets help for heavier activity   Cognition   Overall Cognitive Status Within Functional Limits for tasks assessed   ROM / Strength   AROM / PROM / Strength AROM;Strength;PROM   AROM   Overall AROM Comments Shoulder flexion in scaption 125 degrees   AROM Assessment Site Cervical;Shoulder   PROM   Overall PROM Comments Passively shoulder motion WFL and with end range pain.   PROM Assessment Site Cervical   Cervical Flexion 35   Cervical Extension 45   Cervical - Right Side Bend 40   Cervical - Left Side Bend 30   Cervical - Right Rotation 55   Cervical - Left Rotation 55   Strength   Overall Strength Comments Bilateral UE strength equal and WFL   Ambulation/Gait   Gait Comments SPC walks at slower pace                    [facility] Adult PT Treatment/Exercise - 10/12/14 0936    Self-Care   Self-Care Posture   Posture Support to back and arms and cervical and scapula retraction and why this is helpful in long run for pain.   Exercises   Exercises Neck   Neck Exercises: Seated   Other Seated Exercise Cervical rtraction and elongation , scapula retraction                PT Education - 10/12/14 1137    Education provided Yes   Education Details POC, posture,    Person(s) Educated Patient   Methods Explanation;Demonstration;Tactile cues;Verbal cues;Handout   Comprehension Returned demonstration;Verbalized understanding          PT Short Term Goals - 10/12/14 1144    PT SHORT TERM GOAL #1   Title She will demo understanding of good sitting posture   Time 3   Period Weeks   Status New   PT SHORT TERM GOAL #2   Title She will report neck pain improved 30%    Time 3   Period Weeks   Status New   PT SHORT TERM GOAL #3   Title She will report HA improved 30%   Time 3   Period Weeks   Status New   PT SHORT TERM GOAL #4   Title She will be independent with inital HEP    Time 3   Period Weeks   Status New           PT Long Term Goals - 10/12/14 1145    PT LONG TERM GOAL #1   Title She will be independnet with all HEP issued as of last visit   Time 6   Period Weeks   Status New   PT LONG TERM GOAL #2   Title She will rpeort decreased neck pain back to pre flareup level of pain /frequency   Time 6   Period Weeks   Status New   PT LONG TERM GOAL #3   Title She will report headaches improved 75% with frequency and intensity no limiting ADL's   Time 6   Period Weeks   Status New   PT LONG TERM GOAL #4   Title She will improve cervical sidebending to 40 degrees bilaterally    Time 6   Period Weeks   Status New               Plan - 10/12/14 1140  Clinical Impression Statement Ms Keller presents with chronic pain issues . Her neck pain and  headaches are worse / more frequent in past month or so. She has poor forward head posture  with rounded shoulders , postural weakness and stiffness, neck pain with head aches and decreased neck ROM   Pt will benefit from skilled therapeutic intervention in order to improve on the following deficits Decreased range of motion;Pain;Increased muscle spasms;Decreased activity tolerance;Postural dysfunction;Decreased strength   Rehab Potential Good   PT Frequency 2x / week   PT Duration 6 weeks  if improving at 3-4 weeks   PT Treatment/Interventions Traction;Ultrasound;Moist Heat;Cryotherapy;Therapeutic exercise;Manual techniques;Taping;Dry needling;Patient/family education;Passive range of motion   PT Next Visit Plan Manual treatment for STW, ROM , cervical and scapula strength, modalities PRN   PT Home Exercise Plan posture awareness   Consulted and Agree with Plan of Care Patient          G-Codes - 10-25-14 1151    Functional Assessment Tool Used FOTO 59%    Functional Limitation Changing and maintaining body position   Changing and Maintaining Body Position Current Status (Z6109) At least 40 percent but less than 60 percent impaired, limited or restricted   Changing and Maintaining Body Position Goal Status (U0454) At least 20 percent but less than 40 percent impaired, limited or restricted       Problem List Patient Active Problem List   Diagnosis Date Noted  . Vertigo     Caprice Red PT 10/25/2014, 11:54 AM  [facility] [address] [place], [place], [address] Phone: [phone]   Fax:  [phone]

## 2014-10-23 ENCOUNTER — Ambulatory Visit: Payer: Medicare Other | Attending: Physical Medicine and Rehabilitation

## 2014-10-23 DIAGNOSIS — M436 Torticollis: Secondary | ICD-10-CM | POA: Diagnosis present

## 2014-10-23 DIAGNOSIS — R6889 Other general symptoms and signs: Secondary | ICD-10-CM | POA: Diagnosis present

## 2014-10-23 DIAGNOSIS — R293 Abnormal posture: Secondary | ICD-10-CM | POA: Diagnosis present

## 2014-10-23 DIAGNOSIS — M542 Cervicalgia: Secondary | ICD-10-CM | POA: Diagnosis present

## 2014-10-23 DIAGNOSIS — R51 Headache: Secondary | ICD-10-CM | POA: Insufficient documentation

## 2014-10-23 NOTE — Therapy (Signed)
[facility] [address] [place], [place], [address] Phone: [phone]   Fax:  [phone]  Physical Therapy Treatment  Patient Details  Name: Alicia Klein MRN: [id number] Date of Birth: 06-03-1955 Referring Provider:  Callie Fielding, MD  Encounter Date: 10/23/2014      PT End of Session - 10/23/14 1304    Visit Number 2   Number of Visits 12   Date for PT Re-Evaluation 11/23/14   PT Start Time 1230   PT Stop Time 1315   PT Time Calculation (min) 45 min   Activity Tolerance Patient tolerated treatment well;Patient limited by pain   Behavior During Therapy [facility] for tasks assessed/performed      Past Medical History  Diagnosis Date  . Fibromyalgia   . Arthritis   . Vertigo     Past Surgical History  Procedure Laterality Date  . Carpal tunnel release      both wrists  . Knee surgery      right knee    There were no vitals filed for this visit.  Visit Diagnosis:  Abnormal posture  Stiffness of neck  Pain in neck      Subjective Assessment - 10/23/14 1232    Subjective She reports Headaches have eased off a little, neck pain still a problem, weather aggravates pain.    Currently in Pain? Yes  also with headache   Pain Score 8    Pain Location Neck   Pain Orientation Right;Left;Posterior   Pain Descriptors / Indicators Aching   Pain Type Chronic pain   Pain Onset More than a month ago   Pain Frequency Constant                         [facility] Adult PT Treatment/Exercise - 10/23/14 1234    Neck Exercises: Seated   Other Seated Exercise Cervical retraction and elongation , scapula retraction x 5 with gentle manual overpressure   Modalities   Modalities Moist Heat;Ultrasound   Moist Heat Therapy   Number Minutes Moist Heat 15 Minutes   Moist Heat Location Cervical   Ultrasound   Ultrasound Location neck   Ultrasound Parameters 100%  1.6 Wcm2   Ultrasound Goals Pain   Manual  Therapy   Manual Therapy Taping;Passive ROM;Soft tissue mobilization;Myofascial release   Soft tissue mobilization With use of [place] tool andterior lateral and posterior neck to traps and upper T/S   Passive ROM Stretching neck RT and Lt rotaiton and side bending   20 sec x 2                   PT Short Term Goals - 10/23/14 1306    PT SHORT TERM GOAL #1   Title She will demo understanding of good sitting posture   Status On-going   PT SHORT TERM GOAL #2   Title She will report neck pain improved 30%    Status On-going   PT SHORT TERM GOAL #3   Title She will report HA improved 30%   Status On-going   PT SHORT TERM GOAL #4   Title She will be independent with inital HEP    Status On-going           PT Long Term Goals - 10/12/14 1145    PT LONG TERM GOAL #1   Title She will be independnet with all HEP issued as of last visit   Time 6  Period Weeks   Status New   PT LONG TERM GOAL #2   Title She will rpeort decreased neck pain back to pre flareup level of pain /frequency   Time 6   Period Weeks   Status New   PT LONG TERM GOAL #3   Title She will report headaches improved 75% with frequency and intensity no limiting ADL's   Time 6   Period Weeks   Status New   PT LONG TERM GOAL #4   Title She will improve cervical sidebending to 40 degrees bilaterally    Time 6   Period Weeks   Status New               Plan - 10/23/14 1304    Clinical Impression Statement She was very tender to STW in all muscles RT and LT. Cervical rotation and sidebend range WFL. She was able to perform inital HEP corectly   PT Next Visit Plan Manual treatment for STW, ROM , cervical and scapula strength, modalities PRN Start band exercises yellow for HEP   Consulted and Agree with Plan of Care Patient        Problem List Patient Active Problem List   Diagnosis Date Noted  . Vertigo     Caprice Red PT 10/23/2014, 1:08 PM  [facility] [address] [place], [place], [address] Phone: [phone]   Fax:  [phone]

## 2014-10-29 ENCOUNTER — Ambulatory Visit: Payer: Medicare Other

## 2014-10-29 DIAGNOSIS — M436 Torticollis: Secondary | ICD-10-CM

## 2014-10-29 DIAGNOSIS — R519 Headache, unspecified: Secondary | ICD-10-CM

## 2014-10-29 DIAGNOSIS — R51 Headache: Secondary | ICD-10-CM

## 2014-10-29 DIAGNOSIS — R293 Abnormal posture: Secondary | ICD-10-CM

## 2014-10-29 DIAGNOSIS — M542 Cervicalgia: Secondary | ICD-10-CM

## 2014-10-29 NOTE — Patient Instructions (Signed)
Total motion release trunk rotation  Stretch x 5 to RT 12-15 sec to improved trunk rotation . 2x/day for home

## 2014-10-29 NOTE — Therapy (Signed)
[facility] [address] [place], [place], [address] Phone: [phone]   Fax:  [phone]  Physical Therapy Treatment  Patient Details  Name: Alicia Klein MRN: [id number] Date of Birth: 01/09/1956 No Data Recorded  Encounter Date: 10/29/2014      PT End of Session - 10/29/14 1316    Visit Number 3   Number of Visits 12   Date for PT Re-Evaluation 11/23/14   PT Start Time 1225   PT Stop Time 1325   PT Time Calculation (min) 60 min   Activity Tolerance Patient limited by pain;Patient tolerated treatment well   Behavior [facility] Therapy [facility] for tasks assessed/performed      Past Medical History  Diagnosis Date  . Fibromyalgia   . Arthritis   . Vertigo     Past Surgical History  Procedure Laterality Date  . Carpal tunnel release      both wrists  . Knee surgery      right knee    There were no vitals filed for this visit.  Visit Diagnosis:  Abnormal posture  Stiffness of neck  Pain in neck  Nonintractable episodic headache, unspecified headache type      Subjective Assessment - 10/29/14 1233    Subjective Doing alright I guess.   Currently in Pain? Yes   Pain Score 7    Pain Location Neck   Pain Orientation Right;Left   Pain Descriptors / Indicators Aching   Pain Type Chronic pain   Pain Radiating Towards bilateral head above ears.    Pain Onset More than a month ago   Pain Frequency Constant   Aggravating Factors  anythign   Pain Relieving Factors luing , heat/cold   Multiple Pain Sites Yes                         [facility] Adult PT Treatment/Exercise - 10/29/14 1239    Neck Exercises: Supine   Capital Flexion 10 reps   Moist Heat Therapy   Number Minutes Moist Heat 12 Minutes   Moist Heat Location Cervical   Ultrasound   Ultrasound Location Neck    Ultrasound Parameters 100% 1 MHX , 1.6 Wcm2   Ultrasound Goals Pain   Manual Therapy   Soft tissue mobilization manual in  supine with traction , stretching side bend and rotation, Gr 2 mobs PA upper to lower cervical, STW fro anterior to posterior upper cervical and SCm insertions   Passive ROM Stretching neck RT and Lt rotaiton and side bending   20 sec x 2       Trunk rotation stretching to LT in format of total motion release to less stiff side. Her rotation LT and RT was improved 30-40 % with RT still stiffer than LT.             PT Education - 10/29/14 1322    Education provided Yes   Education Details trunk rotation   Person(s) Educated Patient   Methods Explanation;Demonstration;Verbal cues;Handout   Comprehension Returned demonstration;Verbalized understanding          PT Short Term Goals - 10/23/14 1306    PT SHORT TERM GOAL #1   Title She will demo understanding of good sitting posture   Status On-going   PT SHORT TERM GOAL #2   Title She will report neck pain improved 30%    Status On-going   PT SHORT TERM GOAL #3   Title She will report  HA improved 30%   Status On-going   PT SHORT TERM GOAL #4   Title She will be independent with inital HEP    Status On-going           PT Long Term Goals - 10/12/14 1145    PT LONG TERM GOAL #1   Title She will be independnet with all HEP issued as of last visit   Time 6   Period Weeks   Status New   PT LONG TERM GOAL #2   Title She will rpeort decreased neck pain back to pre flareup level of pain /frequency   Time 6   Period Weeks   Status New   PT LONG TERM GOAL #3   Title She will report headaches improved 75% with frequency and intensity no limiting ADL's   Time 6   Period Weeks   Status New   PT LONG TERM GOAL #4   Title She will improve cervical sidebending to 40 degrees bilaterally    Time 6   Period Weeks   Status New               Plan - 10/29/14 1317    Clinical Impression Statement Still very tneder to STW anterior ,laterla and posterior neck. He rpain score was slightly better today.    PT Next Visit Plan  Manual treatment for STW, ROM , cervical and scapula strength, modalities PRN Start band exercises yellow for HEP, assess trunk rotation RT and LT   PT Home Exercise Plan trunk rotation stretch   Consulted and Agree with Plan of Care Patient        Problem List Patient Active Problem List   Diagnosis Date Noted  . Vertigo     Caprice Red  PT 10/29/2014, 1:25 PM  [facility] [address] [place], [place], [address] Phone: [phone]   Fax:  [phone]  Name: Alicia Klein MRN: [id number] Date of Birth: [birth year]

## 2014-10-30 ENCOUNTER — Encounter: Payer: Medicare Other | Admitting: Physical Therapy

## 2014-11-05 ENCOUNTER — Ambulatory Visit: Payer: Medicare Other | Admitting: Physical Therapy

## 2014-11-05 DIAGNOSIS — R519 Headache, unspecified: Secondary | ICD-10-CM

## 2014-11-05 DIAGNOSIS — R6889 Other general symptoms and signs: Secondary | ICD-10-CM

## 2014-11-05 DIAGNOSIS — R293 Abnormal posture: Secondary | ICD-10-CM

## 2014-11-05 DIAGNOSIS — R51 Headache: Secondary | ICD-10-CM

## 2014-11-05 DIAGNOSIS — M542 Cervicalgia: Secondary | ICD-10-CM

## 2014-11-05 DIAGNOSIS — M436 Torticollis: Secondary | ICD-10-CM

## 2014-11-05 NOTE — Patient Instructions (Signed)
Posture Tips DO: - stand tall and erect - keep chin tucked in - keep head and shoulders in alignment - check posture regularly in mirror or large window - pull head back against headrest in car seat;  Change your position often.  Sit with lumbar support. DON'T: - slouch or slump while watching TV or reading - sit, stand or lie in one position  for too long;  Sitting is especially hard on the spine so if you sit at a desk/use the computer, then stand up often!   Copyright  VHI. All rights reserved.  Posture - Standing   Good posture is important. Avoid slouching and forward head thrust. Maintain curve in low back and align ears over shoul- ders, hips over ankles.  Pull your belly button in toward your back bone. Even weight on feet , ribs lifted up and chin tucked down as shown in clinic.  No military shoulders or [place] head :) Rosey Batheresa that means you..   Copyright  VHI. All rights reserved.  Posture - Sitting   Sit upright, head facing forward. Try using a roll to support lower back. Keep shoulders relaxed, and avoid rounded back. Keep hips level with knees. Avoid crossing legs for long periods. Sit on Sit bones and not tailbones. Do not perch on edge of seat when using computer   Copyright  VHI. All rights reserved.  Levator Stretch   Grasp seat or sit on hand on side to be stretched. Turn head toward other side and look down. Use hand on head to gently stretch neck in that position. Hold __30__ seconds. Repeat on other side. Repeat _2-3___ times. Do _2-3___ sessions per day.  http://gt2.exer.us/30   Copyright  VHI. All rights reserved.  Side-Bending   One hand on opposite side of head, pull head to side as far as is comfortable. Stop if there is pain. Hold _30-60___ seconds. Repeat with other hand to other side. Repeat _2-3___ times. Do _2-3___ sessions per day.   Copyright  VHI. All rights reserved.  Scapular Retraction (Standing)   With arms at sides, pinch shoulder  blades together. Repeat _10___ times per set. Do 1-2___ sets per session. Do _2-3___ sessions per day.  http://orth.exer.us/944   Copyright  VHI. All rights reserved.  Chin Protraction / Retraction   Slide head forward keeping chin level. Slide head back, pulling chin in. Hold each position 5___ seconds. Repeat 5___ times. Do __5-10_ sessions per day.  Copyright  VHI. All rights reserved.   Trigger Point Dry Needling  . What is Trigger Point Dry Needling (DN)? o DN is a physical therapy technique used to treat muscle pain and dysfunction. Specifically, DN helps deactivate muscle trigger points (muscle knots).  o A thin filiform needle is used to penetrate the skin and stimulate the underlying trigger point. The goal is for a local twitch response (LTR) to occur and for the trigger point to relax. No medication of any kind is injected during the procedure.   . What Does Trigger Point Dry Needling Feel Like?  o The procedure feels different for each individual patient. Some patients report that they do not actually feel the needle enter the skin and overall the process is not painful. Very mild bleeding may occur. However, many patients feel a deep cramping in the muscle in which the needle was inserted. This is the local twitch response.   Marland Kitchen. How Will I feel after the treatment? o Soreness is normal, and the onset of soreness may  not occur for a few hours. Typically this soreness does not last longer than two days.  o Bruising is uncommon, however; ice can be used to decrease any possible bruising.  o In rare cases feeling tired or nauseous after the treatment is normal. In addition, your symptoms may get worse before they get better, this period will typically not last longer than 24 hours.   . What Can I do After My Treatment? o Increase your hydration by drinking more water for the next 24 hours. o You may place ice or heat on the areas treated that have become sore, however, do not  use heat on inflamed or bruised areas. Heat often brings more relief post needling. o You can continue your regular activities, but vigorous activity is not recommended initially after the treatment for 24 hours. o DN is best combined with other physical therapy such as strengthening, stretching, and other therapies.   Alicia Klein, PT 11/05/2014 12:47 PM Phone: [phone] Fax: [phone]

## 2014-11-05 NOTE — Therapy (Signed)
[facility] [address] [place], [place], [address] Phone: [phone]   Fax:  [phone]  Physical Therapy Treatment  Patient Details  Name: Alicia Klein MRN: [id number] Date of Birth: 1955-12-03 No Data Recorded  Encounter Date: 11/05/2014      PT End of Session - 11/05/14 1328    Visit Number 3   Number of Visits 12   Date for PT Re-Evaluation 11/23/14   Authorization Type Medicare   PT Start Time 1231   PT Stop Time 1330   PT Time Calculation (min) 59 min   Activity Tolerance Patient limited by pain;Patient tolerated treatment well   Behavior During Therapy [facility] for tasks assessed/performed      Past Medical History  Diagnosis Date  . Fibromyalgia   . Arthritis   . Vertigo     Past Surgical History  Procedure Laterality Date  . Carpal tunnel release      both wrists  . Knee surgery      right knee    There were no vitals filed for this visit.  Visit Diagnosis:  Abnormal posture  Stiffness of neck  Pain in neck  Nonintractable episodic headache, unspecified headache type  Activity intolerance      Subjective Assessment - 11/05/14 1235    Subjective I do not have a headache to day but my neck is about a 7-8/10   Pertinent History Chronic myalgias and fibromyalgia.    Patient Stated Goals Ease pain so I have less headache and more activity   Currently in Pain? Yes   Pain Score 8    Pain Location Neck   Pain Orientation Right;Left   Pain Descriptors / Indicators Aching;Sharp;Sore   Pain Type Chronic pain   Pain Onset More than a month ago   Pain Frequency Constant            [facility] PT Assessment - 11/05/14 1329    PROM   Cervical - Right Rotation 55   Cervical - Left Rotation 60                     [facility] Adult PT Treatment/Exercise - 11/05/14 1240    Exercises   Exercises Neck   Neck Exercises: Seated   Neck Retraction 5 reps;10 secs   Shoulder Rolls  Backwards;Forwards;10 reps   Neck Exercises: Supine   Other Supine Exercise chin tuck with towel roll 5sec hold x 10     Moist Heat Therapy   Number Minutes Moist Heat 15 Minutes   Moist Heat Location Cervical   Ultrasound   Ultrasound Location neck   Ultrasound Parameters 100% 1.5 w/cm2 for 8 minutes    concentration on Right but also left upper traps and cervic   Ultrasound Goals Pain   Manual Therapy   Manual Therapy Passive ROM;Soft tissue mobilization;Joint mobilization   Joint Mobilization PA mobs lateral grade 2 on Right only , Thoracic mobs grade 1/2 PA mobs.  Pt with marked tenderness T 4 to T -6   Soft tissue mobilization cervical paraspinals , upper trap and levator   Passive ROM stretching side bend and rotation with over pressure left and right    Neck Exercises: Stretches   Upper Trapezius Stretch 2 reps;30 seconds  bil VC for technique   Levator Stretch 2 reps;30 seconds  bil VC for technique          Trigger Point Dry Needling - 11/05/14 1250    Consent Given?  Yes   Education Handout Provided Yes   tolerated Right side only today   Muscles Treated Upper Body Upper trapezius;Levator scapulae   Upper Trapezius Response Twitch reponse elicited;Palpable increased muscle length  marked response right side only   Levator Scapulae Response Twitch response elicited;Palpable increased muscle length  marked response right side only              PT Education - 11/05/14 1328    Education provided Yes   Education Details trigger point precautians and aftercare, neck tension stretches and posture sitting and standing   Person(s) Educated Patient   Methods Explanation;Demonstration;Verbal cues;Handout   Comprehension Verbalized understanding;Returned demonstration          PT Short Term Goals - 11/05/14 1330    PT SHORT TERM GOAL #1   Title She will demo understanding of good sitting posture   Time 3   Period Weeks   Status On-going   PT SHORT TERM GOAL  #2   Title She will report neck pain improved 30%    Baseline pt 8/10 today   Time 3   Period Weeks   Status On-going   PT SHORT TERM GOAL #3   Title She will report HA improved 30%   Baseline Pt with no headache today    Time 3   Period Weeks   Status On-going   PT SHORT TERM GOAL #4   Title She will be independent with inital HEP    Time 3   Period Weeks   Status On-going           PT Long Term Goals - 10/12/14 1145    PT LONG TERM GOAL #1   Title She will be independnet with all HEP issued as of last visit   Time 6   Period Weeks   Status New   PT LONG TERM GOAL #2   Title She will rpeort decreased neck pain back to pre flareup level of pain /frequency   Time 6   Period Weeks   Status New   PT LONG TERM GOAL #3   Title She will report headaches improved 75% with frequency and intensity no limiting ADL's   Time 6   Period Weeks   Status New   PT LONG TERM GOAL #4   Title She will improve cervical sidebending to 40 degrees bilaterally    Time 6   Period Weeks   Status New               Plan - 11/05/14 1330    Clinical Impression Statement Pt presents with no headache but 8/10 neck pain left and right but more on Right.  Pt underwent trigger  point dry needling with improvemen in left rotation 5 degrees to 60 degrees.  but pt marked tenderness wiith dry needling and soft tissue massage and can only tolerate  grade 1 and 2 mobs.     PT Treatment/Interventions Traction;Ultrasound;Moist Heat;Cryotherapy;Therapeutic exercise;Manual techniques;Taping;Dry needling;Patient/family education;Passive range of motion   PT Next Visit Plan  assess dry needlling benefit and continueManual treatment for STW, ROM , cervical and scapula strength, modalities PRN Start band exercises yellow for HEP, assess trunk rotation RT and LT   PT Home Exercise Plan posture sitting and standing and neck tension exercise and supine chin tuck   Consulted and Agree with Plan of Care Patient         Problem List Patient Active Problem List   Diagnosis Date Noted  .  Vertigo     Garen Lah, PT 11/05/2014 3:24 PM Phone: [phone] Fax: [phone]  [facility] [address] [place], [place], [address] Phone: [phone]   Fax:  [phone]  Name: Alicia Klein MRN: [id number] Date of Birth: Dec 01, 1955

## 2014-11-08 ENCOUNTER — Ambulatory Visit: Payer: Medicare Other | Admitting: Physical Therapy

## 2014-11-08 DIAGNOSIS — M436 Torticollis: Secondary | ICD-10-CM

## 2014-11-08 DIAGNOSIS — R6889 Other general symptoms and signs: Secondary | ICD-10-CM

## 2014-11-08 DIAGNOSIS — R293 Abnormal posture: Secondary | ICD-10-CM

## 2014-11-08 DIAGNOSIS — R519 Headache, unspecified: Secondary | ICD-10-CM

## 2014-11-08 DIAGNOSIS — M542 Cervicalgia: Secondary | ICD-10-CM

## 2014-11-08 DIAGNOSIS — R51 Headache: Secondary | ICD-10-CM

## 2014-11-08 NOTE — Therapy (Addendum)
[place], [place], [address] Phone: [phone]   Fax:  [phone]  Physical Therapy Treatment/Discharge Note  Patient Details  Name: Alicia Klein MRN: [id number] Date of Birth: 1955/02/27 No Data Recorded  Encounter Date: 11/08/2014      PT End of Session - 11/08/14 1102    Visit Number 4   Number of Visits 12   Date for PT Re-Evaluation 11/23/14   Authorization Type Medicare   PT Start Time 1100   PT Stop Time 1200   PT Time Calculation (min) 60 min   Activity Tolerance Patient limited by pain   Behavior During Therapy [facility] for tasks assessed/performed      Past Medical History  Diagnosis Date  . Fibromyalgia   . Arthritis   . Vertigo     Past Surgical History  Procedure Laterality Date  . Carpal tunnel release      both wrists  . Knee surgery      right knee    There were no vitals filed for this visit.  Visit Diagnosis:  Abnormal posture  Stiffness of neck  Pain in neck  Nonintractable episodic headache, unspecified headache type  Activity intolerance      Subjective Assessment - 11/08/14 1104    Subjective I do have a headache with a sharp pain in the back of my head that brought me to tears.  Not sure I really want to do any dry needling today.   Pertinent History Chronic myalgias and fibromyalgia.    Patient Stated Goals Ease pain so I have less headache and more activity   Currently in Pain? Yes   Pain Score 8    Pain Location Neck   Pain Orientation Right;Left  sub occipital area   Pain Descriptors / Indicators Aching;Sore;Sharp   Pain Type Chronic pain   Pain Onset More than a month ago   Pain Frequency Constant                         [facility] Adult PT Treatment/Exercise - 11/08/14 1129    Neck Exercises: Seated   Shoulder Rolls Backwards;Forwards;10 reps   Neck Exercises: Supine   Other Supine Exercise chin tuck with towel roll 5sec hold  x 10     Other Supine Exercise supine scapular stabilzation series with yellow tband with VC/TC and added time necessary for correct execution for 10 x each   Shoulder Exercises: Supine   Other Supine Exercises supine scapular stabilizatrion with bil flex, abd, diagonals and ER x 10 with VC/TC and extra time for instruction.   Moist Heat Therapy   Number Minutes Moist Heat 15 Minutes   Moist Heat Location Cervical   Electrical Stimulation   Electrical Stimulation Location bil cervical and upper tar   Manual Therapy   Manual Therapy Soft tissue mobilization   Soft tissue mobilization sub occipital release and SCM soft tissue bil and cervical paraspinal with Upper trap    Passive ROM over pressure on cervcail rotation as toleraated by pt.   Neck Exercises: Stretches   Upper Trapezius Stretch 2 reps;30 seconds  bil   Levator Stretch 2 reps;30 seconds  bil     had to reinforce supine exercises.           PT Education - 11/08/14 1108    Education provided Yes   Education Details supine scapular series with yellow tband   Person(s) Educated Patient  Methods Explanation;Demonstration;Verbal cues;Handout   Comprehension Verbalized understanding;Returned demonstration          PT Short Term Goals - 11/05/14 1330    PT SHORT TERM GOAL #1   Title She will demo understanding of good sitting posture   Time 3   Period Weeks   Status On-going   PT SHORT TERM GOAL #2   Title She will report neck pain improved 30%    Baseline pt 8/10 today   Time 3   Period Weeks   Status On-going   PT SHORT TERM GOAL #3   Title She will report HA improved 30%   Baseline Pt with no headache today    Time 3   Period Weeks   Status On-going   PT SHORT TERM GOAL #4   Title She will be independent with inital HEP    Time 3   Period Weeks   Status On-going           PT Long Term Goals - 10/12/14 1145    PT LONG TERM GOAL #1   Title She will be independnet with all HEP issued as of  last visit   Time 6   Period Weeks   Status New   PT LONG TERM GOAL #2   Title She will rpeort decreased neck pain back to pre flareup level of pain /frequency   Time 6   Period Weeks   Status New   PT LONG TERM GOAL #3   Title She will report headaches improved 75% with frequency and intensity no limiting ADL's   Time 6   Period Weeks   Status New   PT LONG TERM GOAL #4   Title She will improve cervical sidebending to 40 degrees bilaterally    Time 6   Period Weeks   Status New               Plan - 11/08/14 1357    Clinical Impression Statement Pt presents with no headache but 8/10 pain left and right and cannot tolerate anything but light soft tissue mobilization.  Pt declined trigger point dry needlling and was educated on importance of exercise and movement with fibromyalgia symptoms.  Pt was given estim and heat since she remained at 8/10 pain after exercises and soft tissue work.  Pt to follow up with MD on Monday .   PT Next Visit Plan review supine scapular stabilzation with yellow t band.  and asssess treamtne for management of pain   Consulted and Agree with Plan of Care Patient        Problem List Patient Active Problem List   Diagnosis Date Noted  . Vertigo    Voncille Lo, PT 11/08/2014 2:05 PM Phone: [phone] Fax: [facility] [place], [place], [address] Phone: [phone]   Fax:  [phone]  Name: Alicia Klein MRN: [id number] Date of Birth: 05-25-1955   Late Entry G-code:  11-08-14  Clinical Judgement Current: Goal: CJ DC: CK  PHYSICAL THERAPY DISCHARGE SUMMARY  Visits from Start of Care: 4  Current functional level related to goals / functional outcomes: As above   Remaining deficits: Unknown / did not return for subsequent visits   Education / Equipment: Initial HEP/ modalities Plan:  Patient goals were not met. Patient is being discharged due to not returning since the last visit.  ?????        Voncille Lo, PT 01/10/2015 8:37 AM Phone: [phone] Fax: [phone]

## 2014-11-08 NOTE — Patient Instructions (Signed)
Over Head Pull: Narrow Grip       On back, knees bent, feet flat, band across thighs, elbows straight but relaxed. Pull hands apart (start). Keeping elbows straight, bring arms up and over head, hands toward floor. Keep pull steady on band. Hold momentarily. Return slowly, keeping pull steady, back to start. Repeat _10__ times. Band color __yellow____   Side Pull: Double Arm   On back, knees bent, feet flat. Arms perpendicular to body, shoulder level, elbows straight but relaxed. Pull arms out to sides, elbows straight. Resistance band comes across collarbones, hands toward floor. Hold momentarily. Slowly return to starting position. Repeat _10__ times. Band color yellow____   Sash   On back, knees bent, feet flat, left hand on left hip, right hand above left. Pull right arm DIAGONALLY (hip to shoulder) across chest. Bring right arm along head toward floor. Hold momentarily. Slowly return to starting position. Repeat _10__ times. Do with left arm. Band color _yellow_____   Shoulder Rotation: Double Arm   On back, knees bent, feet flat, elbows tucked at sides, bent 90, hands palms up. Pull hands apart and down toward floor, keeping elbows near sides. Hold momentarily. Slowly return to starting position. Repeat _10__ times. Band color _yellow_____      Supine Push    Take a deep breath and exhale while pushing the back of neck down on the bed. Hold _10___ seconds. Towel roll under neck for support .Repeat 10__ times. Do 1-2____ sessions per day.  http://gt2.exer.us/1   Copyright  VHI. All rights reserved.   Garen LahLawrie Vane Yapp, PT 11/08/2014 11:11 AM Phone: [phone] Fax: [phone]

## 2014-11-13 ENCOUNTER — Ambulatory Visit: Payer: Medicare Other | Admitting: Physical Therapy

## 2014-12-24 ENCOUNTER — Other Ambulatory Visit: Payer: Self-pay

## 2014-12-24 DIAGNOSIS — Z1231 Encounter for screening mammogram for malignant neoplasm of breast: Secondary | ICD-10-CM

## 2015-01-24 ENCOUNTER — Ambulatory Visit
Admission: RE | Admit: 2015-01-24 | Discharge: 2015-01-24 | Disposition: A | Discharge status: Home / Self Care | Payer: Medicare Other | Source: Ambulatory Visit

## 2015-01-24 DIAGNOSIS — Z1231 Encounter for screening mammogram for malignant neoplasm of breast: Secondary | ICD-10-CM

## 2015-05-16 ENCOUNTER — Other Ambulatory Visit: Payer: Self-pay | Admitting: Orthopedic Surgery

## 2015-05-16 DIAGNOSIS — S83249A Other tear of medial meniscus, current injury, unspecified knee, initial encounter: Secondary | ICD-10-CM

## 2015-05-24 ENCOUNTER — Ambulatory Visit
Admission: RE | Admit: 2015-05-24 | Discharge: 2015-05-24 | Disposition: A | Discharge status: Home / Self Care | Payer: Medicare Other | Source: Ambulatory Visit | Attending: Orthopedic Surgery | Admitting: Orthopedic Surgery

## 2015-05-24 DIAGNOSIS — S83249A Other tear of medial meniscus, current injury, unspecified knee, initial encounter: Secondary | ICD-10-CM

## 2015-11-27 ENCOUNTER — Encounter (HOSPITAL_COMMUNITY): Payer: Self-pay | Admitting: *Deleted

## 2015-12-04 ENCOUNTER — Encounter (HOSPITAL_COMMUNITY): Payer: Self-pay | Admitting: Certified Registered Nurse Anesthetist

## 2015-12-04 ENCOUNTER — Ambulatory Visit (HOSPITAL_COMMUNITY)
Admission: RE | Admit: 2015-12-04 | Discharge: 2015-12-04 | Disposition: A | Discharge status: Home / Self Care | Payer: Medicare Other | Source: Ambulatory Visit | Attending: Gastroenterology | Admitting: Gastroenterology

## 2015-12-04 ENCOUNTER — Ambulatory Visit (HOSPITAL_COMMUNITY): Payer: Medicare Other | Admitting: Certified Registered Nurse Anesthetist

## 2015-12-04 ENCOUNTER — Encounter (HOSPITAL_COMMUNITY)
Admission: RE | Disposition: A | Discharge status: Home / Self Care | Payer: Self-pay | Source: Ambulatory Visit | Attending: Gastroenterology

## 2015-12-04 DIAGNOSIS — K921 Melena: Secondary | ICD-10-CM | POA: Insufficient documentation

## 2015-12-04 HISTORY — DX: Major depressive disorder, single episode, unspecified: F32.9

## 2015-12-04 HISTORY — DX: Ventricular premature depolarization: I49.3

## 2015-12-04 HISTORY — DX: Sleep apnea, unspecified: G47.30

## 2015-12-04 HISTORY — DX: Gastro-esophageal reflux disease without esophagitis: K21.9

## 2015-12-04 HISTORY — PX: COLONOSCOPY WITH PROPOFOL: SHX5780

## 2015-12-04 HISTORY — DX: Headache: R51

## 2015-12-04 HISTORY — DX: Prediabetes: R73.03

## 2015-12-04 HISTORY — DX: Depression, unspecified: F32.A

## 2015-12-04 HISTORY — DX: Essential (primary) hypertension: I10

## 2015-12-04 HISTORY — DX: Headache, unspecified: R51.9

## 2015-12-04 SURGERY — COLONOSCOPY WITH PROPOFOL
Anesthesia: Monitor Anesthesia Care

## 2015-12-04 MED ORDER — PROPOFOL 10 MG/ML IV BOLUS
INTRAVENOUS | Status: AC
  Filled 2015-12-04: qty 60

## 2015-12-04 MED ORDER — PROPOFOL 500 MG/50ML IV EMUL
INTRAVENOUS | Status: DC | PRN
  Administered 2015-12-04 (×2): 30 mg via INTRAVENOUS

## 2015-12-04 MED ORDER — PROPOFOL 500 MG/50ML IV EMUL
INTRAVENOUS | Status: DC | PRN
  Administered 2015-12-04: 100 ug/kg/min via INTRAVENOUS

## 2015-12-04 MED ORDER — LACTATED RINGERS IV SOLN
INTRAVENOUS | Status: DC
  Administered 2015-12-04: 50 mL via INTRAVENOUS

## 2015-12-04 SURGICAL SUPPLY — 21 items

## 2015-12-04 NOTE — Anesthesia Preprocedure Evaluation (Addendum)
Anesthesia Evaluation  Patient identified by MRN, date of birth, ID band Patient awake    Reviewed: Allergy & Precautions, NPO status   Airway Mallampati: III  TM Distance: <3 FB Neck ROM: Full  Mouth opening: Limited Mouth Opening  Dental  (+) Teeth Intact, Dental Advisory Given   Pulmonary sleep apnea ,    breath sounds clear to auscultation       Cardiovascular hypertension,  Rhythm:Regular Rate:Normal     Neuro/Psych    GI/Hepatic GERD  ,  Endo/Other    Renal/GU      Musculoskeletal  (+) Arthritis ,   Abdominal   Peds  Hematology   Anesthesia Other Findings   Reproductive/Obstetrics                            Anesthesia Physical Anesthesia Plan  ASA: II  Anesthesia Plan: MAC   Post-op Pain Management:    Induction:   Airway Management Planned: Simple Face Mask and Natural Airway  Additional Equipment:   Intra-op Plan:   Post-operative Plan:   Informed Consent:   Plan Discussed with: CRNA  Anesthesia Plan Comments:         Anesthesia Quick Evaluation

## 2015-12-04 NOTE — Transfer of Care (Signed)
Immediate Anesthesia Transfer of Care Note  Patient: Alicia Klein  Procedure(s) Performed: Procedure(s): COLONOSCOPY WITH PROPOFOL (N/A)  Patient Location: PACU  Anesthesia Type:MAC  Level of Consciousness: sedated, patient cooperative and responds to stimulation  Airway & Oxygen Therapy: Patient Spontanous Breathing and Patient connected to face mask oxygen  Post-op Assessment: Report given to RN and Post -op Vital signs reviewed and stable  Post vital signs: Reviewed and stable  Last Vitals:  Vitals:   12/04/15 0816  BP: (!) 146/69  Pulse: 62  Resp: 17  Temp: 36.7 C    Last Pain:  Vitals:   12/04/15 0816  TempSrc: Oral         Complications: No apparent anesthesia complications

## 2015-12-04 NOTE — Op Note (Signed)
[facility] Patient Name: Alicia Klein Procedure Date: 12/04/2015 MRN: [id number] Attending MD: Alicia Klein , MD Date of Birth: 1955/09/18 CSN: [id number] Age: 60 Admit Type: Outpatient Procedure:                Colonoscopy Indications:              Heme positive stool Providers:                Alicia Shiver, MD, Alicia Sarna, RN, Alicia Klein,                            Technician, Alicia Mo, CRNA Referring MD:              Medicines:                Monitored Anesthesia Care Complications:            No immediate complications. Estimated Blood Loss:     Estimated blood loss: none. Procedure:                Pre-Anesthesia Assessment:                           - Prior to the procedure, a History and Physical                            was performed, and patient medications and                            allergies were reviewed. The patient's tolerance of                            previous anesthesia was also reviewed. The risks                            and benefits of the procedure and the sedation                            options and risks were discussed with the patient.                            All questions were answered, and informed consent                            was obtained. Prior Anticoagulants: The patient has                            taken no previous anticoagulant or antiplatelet                            agents. ASA Grade Assessment: III - A patient with                            severe systemic disease. After reviewing the risks  and benefits, the patient was deemed in                            satisfactory condition to undergo the procedure.                           After obtaining informed consent, the colonoscope                            was passed under direct vision. Throughout the                            procedure, the patient's blood pressure, pulse, and                            oxygen  saturations were monitored continuously. The                            EC-[id number] ([id number] scope was introduced through                            the anus and advanced to the the cecum, identified                            by appendiceal orifice and ileocecal valve. The                            colonoscopy was performed without difficulty. The                            ileocecal valve, appendiceal orifice, and rectum                            were photographed. The colonoscopy was performed                            without difficulty. The patient tolerated the                            procedure well. The quality of the bowel                            preparation was adequate. Scope In: 9:08:06 AM Scope Out: 9:21:28 AM Scope Withdrawal Time: 0 hours 8 minutes 40 seconds  Total Procedure Duration: 0 hours 13 minutes 22 seconds  Findings:      The perianal and digital rectal examinations were normal.      The colon (entire examined portion) appeared normal. Impression:               - The entire examined colon is normal.                           - No specimens collected. Moderate Sedation:      . Recommendation:           - Resume  regular diet.                           - Continue present medications.                           - Repeat colonoscopy in 10 years for screening                            purposes.                           - Return to referring physician. Procedure Code(s):        --- Professional ---                           [phone] [id number], Colonoscopy, flexible; diagnostic, including                            collection of specimen(s) by brushing or washing,                            when performed (separate procedure) Diagnosis Code(s):        --- Professional ---                           R19.5, Other fecal abnormalities CPT copyright 2016 American Medical Association. All rights reserved. The codes documented in this report are preliminary and upon coder review  may  be revised to meet current compliance requirements. Alicia ShiverSalem F Kristinia Leavy, MD 12/04/2015 9:27:32 AM This report has been signed electronically. Number of Addenda: 0

## 2015-12-04 NOTE — Discharge Instructions (Signed)

## 2015-12-04 NOTE — Anesthesia Postprocedure Evaluation (Signed)
Anesthesia Post Note  Patient: Eppie Gibsoneresa R Aytes  Procedure(s) Performed: Procedure(s) (LRB): COLONOSCOPY WITH PROPOFOL (N/A)  Patient location during evaluation: Endoscopy Anesthesia Type: MAC Level of consciousness: awake and alert Pain management: pain level controlled Vital Signs Assessment: post-procedure vital signs reviewed and stable Respiratory status: spontaneous breathing, nonlabored ventilation, respiratory function stable and patient connected to nasal cannula oxygen Cardiovascular status: stable and blood pressure returned to baseline Anesthetic complications: no    Last Vitals:  Vitals:   12/04/15 0940 12/04/15 0947  BP: 116/61   Pulse: 61 (!) 57  Resp: 20 (!) 23  Temp:      Last Pain:  Vitals:   12/04/15 0927  TempSrc: Oral                 Namrata Dangler,JAMES TERRILL

## 2015-12-04 NOTE — H&P (Signed)
The patient is a 60 year old female who presents to the outpatient endoscopy department for colonoscopy because of heme positive stool  Physical:  She is in no distress, nonicteric, heart regular rhythm no murmurs, lungs clear, abdomen soft and nontender  Impression heme positive stool  Plan colonoscopy

## 2015-12-09 ENCOUNTER — Encounter (HOSPITAL_COMMUNITY): Payer: Self-pay | Admitting: Gastroenterology

## 2015-12-31 ENCOUNTER — Other Ambulatory Visit: Payer: Self-pay | Admitting: Family Medicine

## 2015-12-31 DIAGNOSIS — Z1231 Encounter for screening mammogram for malignant neoplasm of breast: Secondary | ICD-10-CM

## 2016-02-11 ENCOUNTER — Ambulatory Visit
Admission: RE | Admit: 2016-02-11 | Discharge: 2016-02-11 | Disposition: A | Discharge status: Home / Self Care | Payer: Medicare Other | Source: Ambulatory Visit | Attending: Family Medicine | Admitting: Family Medicine

## 2016-02-11 DIAGNOSIS — Z1231 Encounter for screening mammogram for malignant neoplasm of breast: Secondary | ICD-10-CM

## 2017-02-10 ENCOUNTER — Other Ambulatory Visit: Payer: Self-pay | Admitting: Family Medicine

## 2017-02-10 DIAGNOSIS — Z1231 Encounter for screening mammogram for malignant neoplasm of breast: Secondary | ICD-10-CM

## 2017-02-11 ENCOUNTER — Ambulatory Visit
Admission: RE | Admit: 2017-02-11 | Discharge: 2017-02-11 | Disposition: A | Discharge status: Home / Self Care | Payer: Medicare Other | Source: Ambulatory Visit | Attending: Family Medicine | Admitting: Family Medicine

## 2017-02-11 DIAGNOSIS — Z1231 Encounter for screening mammogram for malignant neoplasm of breast: Secondary | ICD-10-CM

## 2017-05-06 ENCOUNTER — Other Ambulatory Visit: Payer: Self-pay | Admitting: Family Medicine

## 2017-05-06 ENCOUNTER — Other Ambulatory Visit (HOSPITAL_COMMUNITY)
Admission: RE | Admit: 2017-05-06 | Discharge: 2017-05-06 | Disposition: A | Discharge status: Home / Self Care | Payer: Medicare Other | Source: Ambulatory Visit | Attending: Family Medicine | Admitting: Family Medicine

## 2017-05-06 DIAGNOSIS — Z124 Encounter for screening for malignant neoplasm of cervix: Secondary | ICD-10-CM | POA: Insufficient documentation

## 2017-05-10 LAB — CYTOLOGY - PAP: DIAGNOSIS: NEGATIVE

## 2017-09-05 ENCOUNTER — Encounter (HOSPITAL_COMMUNITY): Payer: Self-pay

## 2017-09-05 ENCOUNTER — Other Ambulatory Visit: Payer: Self-pay

## 2017-09-05 ENCOUNTER — Ambulatory Visit (INDEPENDENT_AMBULATORY_CARE_PROVIDER_SITE_OTHER): Payer: Medicare Other

## 2017-09-05 ENCOUNTER — Ambulatory Visit (HOSPITAL_COMMUNITY)
Admission: EM | Admit: 2017-09-05 | Discharge: 2017-09-05 | Disposition: A | Discharge status: Home / Self Care | Payer: Medicare Other | Attending: Physician Assistant | Admitting: Physician Assistant

## 2017-09-05 DIAGNOSIS — M79672 Pain in left foot: Secondary | ICD-10-CM | POA: Diagnosis not present

## 2017-09-05 DIAGNOSIS — M898X7 Other specified disorders of bone, ankle and foot: Secondary | ICD-10-CM

## 2017-09-05 MED ORDER — COLCHICINE 0.6 MG PO TABS: ORAL_TABLET | ORAL | 1 refills | Status: DC

## 2017-09-05 NOTE — Discharge Instructions (Addendum)
Your xray is normal.  Start the colchicine today. If the pain continues call [facility]. I have provided the address and phone number.

## 2017-09-05 NOTE — ED Triage Notes (Signed)
Foot pain that started today. Left foot

## 2017-09-05 NOTE — ED Provider Notes (Signed)
09/05/2017 5:04 PM   DOB: 06/04/1955 / MRN: [id number]  SUBJECTIVE:  Alicia Klein is a 62 y.o. female presenting for worsening exquisite pain about the left first MTP that started toda.  Assoicates throbbing sensation.  Denies history of gout.  Has tried normal meds which include NSAIDs without relief.  She does take 80 lasix daily.   She is allergic to sulfa antibiotics; amoxicillin; garlic; onion; percocet [oxycodone-acetaminophen]; and wellbutrin [bupropion].   She  has a past medical history of Arthritis, Depression, Fibromyalgia, GERD (gastroesophageal reflux disease), Headache, Hypertension, Pre-diabetes, PVC (premature ventricular contraction), Sleep apnea, and Vertigo.    She  reports that she has never smoked. She has never used smokeless tobacco. She reports that she does not drink alcohol or use drugs. She  has no sexual activity history on file. The patient  has a past surgical history that includes Carpal tunnel release; Knee surgery; right ankle surgery ; and Colonoscopy with propofol (N/A, 12/04/2015).  Her family history is not on file.  Review of Systems  Constitutional: Negative for chills and fever.  HENT: Negative for sore throat.   Respiratory: Negative for cough.   Cardiovascular: Negative for chest pain.  Musculoskeletal: Positive for joint pain.  Skin: Negative for itching and rash.  Neurological: Negative for dizziness.    OBJECTIVE:  BP 115/62   Pulse 64   Temp 97.8 F (36.6 C)   Resp 18   Wt 300 lb (136.1 kg)   SpO2 98%   BMI 51.49 kg/m   Wt Readings from Last 3 Encounters:  09/05/17 300 lb (136.1 kg)  12/04/15 (!) 305 lb (138.3 kg)   Temp Readings from Last 3 Encounters:  09/05/17 97.8 F (36.6 C)  12/04/15 97.9 F (36.6 C) (Oral)  12/31/12 98.2 F (36.8 C) (Oral)   BP Readings from Last 3 Encounters:  09/05/17 115/62  12/04/15 116/61  12/31/12 103/57   Pulse Readings from Last 3 Encounters:  09/05/17 64  12/04/15 (!) 57  12/31/12  (!) 59    Physical Exam  Constitutional: She is oriented to person, place, and time. She appears well-nourished. No distress.  Eyes: Pupils are equal, round, and reactive to light. EOM are normal.  Cardiovascular: Normal rate.  Pulmonary/Chest: Effort normal.  Abdominal: She exhibits no distension.  Musculoskeletal: She exhibits tenderness. She exhibits no edema or deformity.       Feet:  Neurological: She is alert and oriented to person, place, and time. No cranial nerve deficit. Gait normal.  Skin: Skin is dry. She is not diaphoretic.  Psychiatric: She has a normal mood and affect.  Vitals reviewed.       No results found for this or any previous visit (from the past 72 hour(s)).  Dg Foot Complete Left  Result Date: 09/05/2017 CLINICAL DATA:  LEFT foot pain today.  No known injury. EXAM: LEFT FOOT - COMPLETE 3+ VIEW COMPARISON:  None. FINDINGS: Osseous alignment is normal. No fracture line or displaced fracture fragment seen. No cortical irregularity. No acute or suspicious osseous lesion. No significant degenerative change. Soft tissues about the LEFT foot are unremarkable. IMPRESSION: Negative. Electronically Signed   By: Bary RichardStan  Maynard M.D.   On: 09/05/2017 16:32    ASSESSMENT AND PLAN:   Pain in metatarsus of left foot: Colchicine.  Patient seemed unhappy with the service today.  Advised the other option would be prednisone.  She was not happy with that option either.  I have advised she call podiatry should the problem  continue.     Discharge Instructions     Your xray is normal.  Start the colchicine today.        The patient is advised to call or return to clinic if she does not see an improvement in symptoms, or to seek the care of the closest emergency department if she worsens with the above plan.   Deliah Boston, MHS, PA-C 09/05/2017 5:04 PM   Alicia Neas, PA-C 09/05/17 1705

## 2018-01-27 ENCOUNTER — Encounter: Payer: Self-pay | Admitting: Neurology

## 2018-01-28 ENCOUNTER — Other Ambulatory Visit: Payer: Self-pay | Admitting: Family Medicine

## 2018-01-28 DIAGNOSIS — Z1231 Encounter for screening mammogram for malignant neoplasm of breast: Secondary | ICD-10-CM

## 2018-02-10 NOTE — Progress Notes (Signed)
Subjective:   Alicia Klein was seen in consultation in the movement disorder clinic at the request of Johny BlamerHarris, William, MD.  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 Murray HodgkinsBartko) who presents today for the evaluation of tremor.  Tremor started approximately 2 years ago and involves the R>L hand.  It is also in the tongue, jaw, head.  It is intermittent.  She states that her pain management dr told her it was ET.  It is worse with fatigue and "I have chronic fatigue and chronic pain."  Tremor is most noticeable when fatigued or if trying to do something with fine motor coordination.   There is no family hx of tremor.    Affected by caffeine:  No. (2 cups 1/2 caff per day) Affected by alcohol:  Doesn't drink EtOH Affected by stress:  No. Affected by fatigue:  No. Spills soup if on spoon:  No. Spills glass of liquid if full:  May or may not have trouble - states that she may drop it but may or may not be related to tremor Affects ADL's (tying shoes, brushing teeth, etc):  No.  Current/Previously tried tremor medications: metoprolol 100 mg (on for bp); topamax - 50 mg tid (on for migraine)  Current medications that may exacerbate tremor:  n/a  Outside reports reviewed: historical medical records, office notes and referral letter/letters.  Allergies  Allergen Reactions  . Sulfa Antibiotics Nausea And Vomiting  . Amoxicillin Hives and Rash    Has patient had a PCN reaction causing immediate rash, facial/tongue/throat swelling, SOB or lightheadedness with hypotension: {no Has patient had a PCN reaction causing severe rash involving mucus membranes or skin necrosis: {no Has patient had a PCN reaction that required hospitalization {no Has patient had a PCN reaction occurring within the last 10 years: {no If all of the above answers are "NO", then may proceed with Cephalosporin use.  . Garlic Other (See Comments)    Causes knots in stomach    . Onion Other (See Comments)    Causes knots in stomach  . Percocet [Oxycodone-Acetaminophen] Nausea Only  . Wellbutrin [Bupropion]     Emotional reactin     Outpatient Encounter Medications as of 02/14/2018  Medication Sig  . Acetaminophen (TYLENOL 8 HOUR ARTHRITIS PAIN PO) Take 1,300 mg by mouth every 8 (eight) hours as needed.  Marland Kitchen. aspirin EC 81 MG tablet Take 81 mg by mouth daily.  . B Complex Vitamins (B COMPLEX PO) Place 1 mL under the tongue daily.   . baclofen (LIORESAL) 10 MG tablet Take 10 mg by mouth 2 (two) times daily as needed (headache). Limited to 2 episodes per week  . cholecalciferol (VITAMIN D) 1000 UNITS tablet Take 4,000 Units by mouth every evening.  . citalopram (CELEXA) 40 MG tablet Take 40 mg by mouth at bedtime.   . colchicine 0.6 MG tablet Take two tabs once then one an hour later.  Repeat tomorrow if needed.  . furosemide (LASIX) 80 MG tablet Take 80 mg by mouth daily.  Marland Kitchen. gabapentin (NEURONTIN) 600 MG tablet Take 600 mg by mouth 3 (three) times daily.  Marland Kitchen. HYDROcodone-acetaminophen (NORCO/VICODIN) 5-325 MG per tablet Take 1-2 tablets by mouth every 8 (eight) hours as needed for moderate pain.   Marland Kitchen. KLOR-CON M10 10 MEQ tablet Take 1 tablet by mouth daily.  . metoprolol succinate (TOPROL-XL) 100 MG 24 hr tablet Take 100 mg by mouth daily. Take with or  immediately following a meal.  . Multiple Vitamins-Minerals (MULTIVITAMINS THER. W/MINERALS) TABS Take 1 tablet by mouth daily.  . ranitidine (ZANTAC) 150 MG tablet Take 150-300 mg by mouth daily as needed for heartburn.   . rosuvastatin (CRESTOR) 5 MG tablet Take 1 tablet by mouth daily.  Marland Kitchen topiramate (TOPAMAX) 50 MG tablet Take 150 mg by mouth at bedtime. Migraines   No facility-administered encounter medications on file as of 02/14/2018.     Past Medical History:  Diagnosis Date  . Arthritis   . Depression   . Fibromyalgia   . GERD (gastroesophageal reflux disease)   . Headache    hx of migraines   .  Hypertension   . Pre-diabetes   . PVC (premature ventricular contraction)   . Sleep apnea    cpap - does not know settings  . Vertigo     Past Surgical History:  Procedure Laterality Date  . CARPAL TUNNEL RELEASE     both wrists  . COLONOSCOPY WITH PROPOFOL N/A 12/04/2015   Procedure: COLONOSCOPY WITH PROPOFOL;  Surgeon: Graylin Shiver, MD;  Location: [facility];  Service: Endoscopy;  Laterality: N/A;  . KNEE SURGERY     right knee  . right ankle surgery       Social History   Socioeconomic History  . Marital status: Divorced    Spouse name: Not on file  . Number of children: Not on file  . Years of education: Not on file  . Highest education level: Not on file  Occupational History  . Not on file  Social Needs  . Financial resource strain: Not on file  . Food insecurity:    Worry: Not on file    Inability: Not on file  . Transportation needs:    Medical: Not on file    Non-medical: Not on file  Tobacco Use  . Smoking status: Never Smoker  . Smokeless tobacco: Never Used  Substance and Sexual Activity  . Alcohol use: No  . Drug use: No  . Sexual activity: Not on file  Lifestyle  . Physical activity:    Days per week: Not on file    Minutes per session: Not on file  . Stress: Not on file  Relationships  . Social connections:    Talks on phone: Not on file    Gets together: Not on file    Attends religious service: Not on file    Active member of club or organization: Not on file    Attends meetings of clubs or organizations: Not on file    Relationship status: Not on file  . Intimate partner violence:    Fear of current or ex partner: Not on file    Emotionally abused: Not on file    Physically abused: Not on file    Forced sexual activity: Not on file  Other Topics Concern  . Not on file  Social History Narrative  . Not on file    Family Status  Relation Name Status  . Mother  Deceased  . Father  Deceased  . Sister  Alive  . Sister  Deceased   . Daughter  Alive    Review of Systems Review of Systems  Constitutional: Positive for malaise/fatigue.  HENT: Negative.   Eyes: Positive for blurred vision.       Dry eye  Respiratory: Negative.   Cardiovascular: Negative.   Gastrointestinal: Negative.   Genitourinary: Negative.   Musculoskeletal: Positive for back pain and myalgias.  Skin:  Negative.   Neurological: Positive for headaches.  Endo/Heme/Allergies: Negative.      Objective:   VITALS:   Vitals:   02/14/18 1031  BP: 120/70  Pulse: 71  SpO2: 96%  Weight: 264 lb 6 oz (119.9 kg)  Height: 5' 4.5" (1.638 m)   Gen:  Appears stated age and in NAD. HEENT:  Normocephalic, atraumatic. The mucous membranes are moist. The superficial temporal arteries are without ropiness or tenderness. Cardiovascular: Regular rate and rhythm. Lungs: Clear to auscultation bilaterally. Neck: There are no carotid bruits noted bilaterally.  NEUROLOGICAL:  Orientation:  The patient is alert and oriented x 3.  Recent and remote memory are intact.  Attention span and concentration are normal.  Able to name objects and repeat without trouble.  Fund of knowledge is appropriate Cranial nerves: There is good facial symmetry. The pupils are equal round and reactive to light bilaterally. Fundoscopic exam reveals clear disc margins bilaterally. Extraocular muscles are intact and visual fields are full to confrontational testing. Speech is fluent and clear. Soft palate rises symmetrically and there is no tongue deviation. Hearing is intact to conversational tone. Tone: Tone is good throughout. Sensation: Sensation is intact to light touch and pinprick throughout (facial, trunk, extremities). Vibration is intact at the bilateral ankle, but she reports decreased in the left ankle compared to that of the right. There is no extinction with double simultaneous stimulation. There is no sensory dermatomal level identified. Coordination:  The patient has no  dysdiadichokinesia or dysmetria. Motor: Strength is 5/5 in the bilateral upper and lower extremities.  Shoulder shrug is equal bilaterally.  There is no pronator drift.  There are no fasciculations noted. DTR's: Not checked today, as the patient is very sensitive to the touch (request a blood pressure be checked in her forearm as opposed to upper arm because of sensitivity). Gait and Station: The patient pushes off of the chair to arise.  She ambulates fairly well with her cane, although she is slow.  MOVEMENT EXAM: Tremor:  There is a rare tremor in the right pointer finger.  Otherwise, I did not really notice any postural or intention tremor.  She was able to draw Archimedes spiral as well.  She was able to pour water from one glass to another without spilling it.  I did not see any head tremor.  I saw no jaw tremor.  No tongue tremor.  No tongue fasciculations.     Assessment/Plan:   1.  Tremor  -I saw very little tremor today.  I told her this could represent essential tremor, but it was very difficult to tell given the fact that I really saw very little tremor today.  We talked about tremor and medications for it.  She really did not want anything.  She was given reassurance that she did not have Parkinson's disease.  She and I talked about if tremor got worse, then consideration could be given to changing her metoprolol to propranolol.  2.  Paresthesias  -already takes a b complex vitamin so likely high.  Will check b12.    3.  Follow-up on an as-needed basis.  CC:  Johny Blamer, MD

## 2018-02-14 ENCOUNTER — Other Ambulatory Visit: Payer: Medicare Other

## 2018-02-14 ENCOUNTER — Ambulatory Visit: Payer: Medicare Other | Admitting: Neurology

## 2018-02-14 ENCOUNTER — Encounter: Payer: Self-pay | Admitting: Neurology

## 2018-02-14 ENCOUNTER — Other Ambulatory Visit (INDEPENDENT_AMBULATORY_CARE_PROVIDER_SITE_OTHER): Payer: Medicare Other

## 2018-02-14 VITALS — BP 120/70 | HR 71 | Ht 64.5 in | Wt 264.4 lb

## 2018-02-14 DIAGNOSIS — R251 Tremor, unspecified: Secondary | ICD-10-CM

## 2018-02-14 LAB — VITAMIN B12: VITAMIN B 12: 1115 pg/mL — AB (ref 211–911)

## 2018-02-15 ENCOUNTER — Telehealth: Payer: Self-pay | Admitting: Neurology

## 2018-02-15 LAB — CERULOPLASMIN: CERULOPLASMIN: 29 mg/dL (ref 18–53)

## 2018-02-15 NOTE — Telephone Encounter (Signed)
-----   Message from Octaviano Batty Tat, DO sent at 02/15/2018  7:26 AM EST ----- Let pt know that b12 is high, likely due to supplementation.

## 2018-02-15 NOTE — Telephone Encounter (Signed)
Patient made aware of results and that she can d/c B vitamins.

## 2018-02-16 ENCOUNTER — Ambulatory Visit
Admission: RE | Admit: 2018-02-16 | Discharge: 2018-02-16 | Disposition: A | Discharge status: Home / Self Care | Payer: Medicare Other | Source: Ambulatory Visit

## 2018-02-16 ENCOUNTER — Telehealth: Payer: Self-pay | Admitting: Neurology

## 2018-02-16 DIAGNOSIS — Z1231 Encounter for screening mammogram for malignant neoplasm of breast: Secondary | ICD-10-CM

## 2018-02-16 LAB — COPPER, URINE - RANDOM OR 24 HOUR
COPPER / CREATININE RATIO: 15 ug/g{creat} (ref 0–49)
COPPER UR: 26 ug/L
Creatinine(Crt),U: 1.73 g/L (ref 0.30–3.00)

## 2018-02-16 NOTE — Telephone Encounter (Signed)
Patient aware of lab results.

## 2018-02-16 NOTE — Telephone Encounter (Signed)
-----   Message from Octaviano Batty Tat, DO sent at 02/15/2018  5:17 PM EST ----- Let pt know that ceruloplasmin is normal

## 2018-05-26 ENCOUNTER — Other Ambulatory Visit: Payer: Self-pay | Admitting: Physical Medicine and Rehabilitation

## 2018-05-26 DIAGNOSIS — M5414 Radiculopathy, thoracic region: Secondary | ICD-10-CM

## 2018-06-04 ENCOUNTER — Other Ambulatory Visit: Payer: Self-pay

## 2018-06-04 ENCOUNTER — Ambulatory Visit
Admission: RE | Admit: 2018-06-04 | Discharge: 2018-06-04 | Disposition: A | Discharge status: Home / Self Care | Payer: Medicare Other | Source: Ambulatory Visit | Attending: Physical Medicine and Rehabilitation | Admitting: Physical Medicine and Rehabilitation

## 2018-06-04 DIAGNOSIS — M5414 Radiculopathy, thoracic region: Secondary | ICD-10-CM

## 2019-01-12 ENCOUNTER — Other Ambulatory Visit: Payer: Self-pay | Admitting: Family Medicine

## 2019-01-12 DIAGNOSIS — Z1231 Encounter for screening mammogram for malignant neoplasm of breast: Secondary | ICD-10-CM

## 2019-02-23 ENCOUNTER — Other Ambulatory Visit: Payer: Self-pay

## 2019-02-23 ENCOUNTER — Ambulatory Visit
Admission: RE | Admit: 2019-02-23 | Discharge: 2019-02-23 | Disposition: A | Discharge status: Home / Self Care | Payer: Medicare Other | Source: Ambulatory Visit | Attending: Family Medicine | Admitting: Family Medicine

## 2019-02-23 DIAGNOSIS — Z1231 Encounter for screening mammogram for malignant neoplasm of breast: Secondary | ICD-10-CM

## 2019-03-31 ENCOUNTER — Ambulatory Visit: Payer: Medicare Other

## 2019-03-31 ENCOUNTER — Ambulatory Visit: Payer: Medicare Other | Attending: Internal Medicine

## 2019-03-31 DIAGNOSIS — Z23 Encounter for immunization: Secondary | ICD-10-CM

## 2019-03-31 NOTE — Progress Notes (Signed)
   Covid-19 Vaccination Clinic  Name:  EPIPHANY SELTZER    MRN: [id number] DOB: 04/24/1955  03/31/2019  Ms. Flenniken was observed post Covid-19 immunization for 15 minutes without incident. She was provided with Vaccine Information Sheet and instruction to access the V-Safe system.   Ms. Wirick was instructed to call 911 with any severe reactions post vaccine: Marland Kitchen Difficulty breathing  . Swelling of face and throat  . A fast heartbeat  . A bad rash all over body  . Dizziness and weakness   Immunizations Administered    Name Date Dose VIS Date Route   Pfizer COVID-19 Vaccine 03/31/2019 11:15 AM 0.3 mL 12/23/2018 Intramuscular   Manufacturer: [employer], [employer]   Lot: [id number]   NDC: [address]-5

## 2019-04-01 ENCOUNTER — Ambulatory Visit: Payer: Medicare Other

## 2019-04-25 ENCOUNTER — Ambulatory Visit: Payer: Medicare Other | Attending: Internal Medicine

## 2019-04-25 DIAGNOSIS — Z23 Encounter for immunization: Secondary | ICD-10-CM

## 2019-04-25 NOTE — Progress Notes (Signed)
   Covid-19 Vaccination Clinic  Name:  Alicia Klein    MRN: [id number] DOB: 11-Dec-1955  04/25/2019  Alicia Klein was observed post Covid-19 immunization for 15 minutes without incident. She was provided with Vaccine Information Sheet and instruction to access the V-Safe system.   Alicia Klein was instructed to call 911 with any severe reactions post vaccine: Marland Kitchen Difficulty breathing  . Swelling of face and throat  . A fast heartbeat  . A bad rash all over body  . Dizziness and weakness   Immunizations Administered    Name Date Dose VIS Date Route   Pfizer COVID-19 Vaccine 04/25/2019 12:14 PM 0.3 mL 12/23/2018 Intramuscular   Manufacturer: [employer], [employer]   Lot: [id number]   NDC: [address]-0

## 2019-11-11 ENCOUNTER — Ambulatory Visit: Payer: Medicare Other | Attending: Internal Medicine

## 2019-11-11 DIAGNOSIS — Z23 Encounter for immunization: Secondary | ICD-10-CM

## 2019-11-11 NOTE — Progress Notes (Signed)
.    Covid-19 Vaccination Clinic  Name:  Alicia Klein    MRN: [id number] DOB: 1955/06/04  11/11/2019  [name] was observed post Covid-19 immunization for 15 minutes without incident. She was provided with Vaccine Information Sheet and instruction to access the V-Safe system.   [name] was instructed to call 911 with any severe reactions post vaccine: Marland Kitchen Difficulty breathing  . Swelling of face and throat  . A fast heartbeat  . A bad rash all over body  . Dizziness and weakness

## 2019-12-13 ENCOUNTER — Other Ambulatory Visit: Payer: Self-pay | Admitting: Orthopedic Surgery

## 2019-12-13 DIAGNOSIS — M25512 Pain in left shoulder: Secondary | ICD-10-CM

## 2019-12-30 ENCOUNTER — Other Ambulatory Visit: Payer: Self-pay

## 2019-12-30 ENCOUNTER — Ambulatory Visit
Admission: RE | Admit: 2019-12-30 | Discharge: 2019-12-30 | Disposition: A | Discharge status: Home / Self Care | Payer: Medicare Other | Source: Ambulatory Visit | Attending: Orthopedic Surgery | Admitting: Orthopedic Surgery

## 2019-12-30 DIAGNOSIS — M25512 Pain in left shoulder: Secondary | ICD-10-CM

## 2020-01-22 DIAGNOSIS — G4733 Obstructive sleep apnea (adult) (pediatric): Secondary | ICD-10-CM | POA: Diagnosis not present

## 2020-01-22 DIAGNOSIS — M542 Cervicalgia: Secondary | ICD-10-CM | POA: Diagnosis not present

## 2020-01-22 DIAGNOSIS — M47812 Spondylosis without myelopathy or radiculopathy, cervical region: Secondary | ICD-10-CM | POA: Diagnosis not present

## 2020-01-22 DIAGNOSIS — M791 Myalgia, unspecified site: Secondary | ICD-10-CM | POA: Diagnosis not present

## 2020-01-22 DIAGNOSIS — M47817 Spondylosis without myelopathy or radiculopathy, lumbosacral region: Secondary | ICD-10-CM | POA: Diagnosis not present

## 2020-01-22 DIAGNOSIS — M461 Sacroiliitis, not elsewhere classified: Secondary | ICD-10-CM | POA: Diagnosis not present

## 2020-01-22 DIAGNOSIS — Z79899 Other long term (current) drug therapy: Secondary | ICD-10-CM | POA: Diagnosis not present

## 2020-01-22 DIAGNOSIS — M47816 Spondylosis without myelopathy or radiculopathy, lumbar region: Secondary | ICD-10-CM | POA: Diagnosis not present

## 2020-02-05 DIAGNOSIS — M66822 Spontaneous rupture of other tendons, left upper arm: Secondary | ICD-10-CM | POA: Diagnosis not present

## 2020-02-05 DIAGNOSIS — M7522 Bicipital tendinitis, left shoulder: Secondary | ICD-10-CM | POA: Diagnosis not present

## 2020-02-05 DIAGNOSIS — M19012 Primary osteoarthritis, left shoulder: Secondary | ICD-10-CM | POA: Diagnosis not present

## 2020-02-05 DIAGNOSIS — G8918 Other acute postprocedural pain: Secondary | ICD-10-CM | POA: Diagnosis not present

## 2020-02-05 DIAGNOSIS — S43432A Superior glenoid labrum lesion of left shoulder, initial encounter: Secondary | ICD-10-CM | POA: Diagnosis not present

## 2020-02-05 DIAGNOSIS — M7542 Impingement syndrome of left shoulder: Secondary | ICD-10-CM | POA: Diagnosis not present

## 2020-02-08 DIAGNOSIS — M25612 Stiffness of left shoulder, not elsewhere classified: Secondary | ICD-10-CM | POA: Diagnosis not present

## 2020-02-08 DIAGNOSIS — M6281 Muscle weakness (generalized): Secondary | ICD-10-CM | POA: Diagnosis not present

## 2020-02-09 DIAGNOSIS — M6281 Muscle weakness (generalized): Secondary | ICD-10-CM | POA: Diagnosis not present

## 2020-02-09 DIAGNOSIS — M25612 Stiffness of left shoulder, not elsewhere classified: Secondary | ICD-10-CM | POA: Diagnosis not present

## 2020-02-16 DIAGNOSIS — M25612 Stiffness of left shoulder, not elsewhere classified: Secondary | ICD-10-CM | POA: Diagnosis not present

## 2020-02-16 DIAGNOSIS — M6281 Muscle weakness (generalized): Secondary | ICD-10-CM | POA: Diagnosis not present

## 2020-02-20 DIAGNOSIS — M6281 Muscle weakness (generalized): Secondary | ICD-10-CM | POA: Diagnosis not present

## 2020-02-20 DIAGNOSIS — M25612 Stiffness of left shoulder, not elsewhere classified: Secondary | ICD-10-CM | POA: Diagnosis not present

## 2020-02-22 DIAGNOSIS — M25612 Stiffness of left shoulder, not elsewhere classified: Secondary | ICD-10-CM | POA: Diagnosis not present

## 2020-02-22 DIAGNOSIS — M6281 Muscle weakness (generalized): Secondary | ICD-10-CM | POA: Diagnosis not present

## 2020-02-28 DIAGNOSIS — M25612 Stiffness of left shoulder, not elsewhere classified: Secondary | ICD-10-CM | POA: Diagnosis not present

## 2020-02-28 DIAGNOSIS — M6281 Muscle weakness (generalized): Secondary | ICD-10-CM | POA: Diagnosis not present

## 2020-03-01 DIAGNOSIS — M6281 Muscle weakness (generalized): Secondary | ICD-10-CM | POA: Diagnosis not present

## 2020-03-01 DIAGNOSIS — M25612 Stiffness of left shoulder, not elsewhere classified: Secondary | ICD-10-CM | POA: Diagnosis not present

## 2020-03-04 DIAGNOSIS — M6281 Muscle weakness (generalized): Secondary | ICD-10-CM | POA: Diagnosis not present

## 2020-03-04 DIAGNOSIS — M25612 Stiffness of left shoulder, not elsewhere classified: Secondary | ICD-10-CM | POA: Diagnosis not present

## 2020-03-06 DIAGNOSIS — M25612 Stiffness of left shoulder, not elsewhere classified: Secondary | ICD-10-CM | POA: Diagnosis not present

## 2020-03-06 DIAGNOSIS — M6281 Muscle weakness (generalized): Secondary | ICD-10-CM | POA: Diagnosis not present

## 2020-03-07 DIAGNOSIS — R7309 Other abnormal glucose: Secondary | ICD-10-CM | POA: Diagnosis not present

## 2020-03-07 DIAGNOSIS — H35363 Drusen (degenerative) of macula, bilateral: Secondary | ICD-10-CM | POA: Diagnosis not present

## 2020-03-07 DIAGNOSIS — H2513 Age-related nuclear cataract, bilateral: Secondary | ICD-10-CM | POA: Diagnosis not present

## 2020-03-07 DIAGNOSIS — H31011 Macula scars of posterior pole (postinflammatory) (post-traumatic), right eye: Secondary | ICD-10-CM | POA: Diagnosis not present

## 2020-03-07 DIAGNOSIS — H35013 Changes in retinal vascular appearance, bilateral: Secondary | ICD-10-CM | POA: Diagnosis not present

## 2020-03-07 DIAGNOSIS — H35033 Hypertensive retinopathy, bilateral: Secondary | ICD-10-CM | POA: Diagnosis not present

## 2020-03-07 DIAGNOSIS — H40013 Open angle with borderline findings, low risk, bilateral: Secondary | ICD-10-CM | POA: Diagnosis not present

## 2020-03-08 ENCOUNTER — Other Ambulatory Visit: Payer: Self-pay | Admitting: Family Medicine

## 2020-03-08 DIAGNOSIS — Z0000 Encounter for general adult medical examination without abnormal findings: Secondary | ICD-10-CM

## 2020-04-09 DIAGNOSIS — M67912 Unspecified disorder of synovium and tendon, left shoulder: Secondary | ICD-10-CM | POA: Diagnosis not present

## 2020-04-09 DIAGNOSIS — M25512 Pain in left shoulder: Secondary | ICD-10-CM | POA: Diagnosis not present

## 2020-04-16 DIAGNOSIS — G4733 Obstructive sleep apnea (adult) (pediatric): Secondary | ICD-10-CM | POA: Diagnosis not present

## 2020-04-16 DIAGNOSIS — M461 Sacroiliitis, not elsewhere classified: Secondary | ICD-10-CM | POA: Diagnosis not present

## 2020-04-16 DIAGNOSIS — M47816 Spondylosis without myelopathy or radiculopathy, lumbar region: Secondary | ICD-10-CM | POA: Diagnosis not present

## 2020-04-16 DIAGNOSIS — M791 Myalgia, unspecified site: Secondary | ICD-10-CM | POA: Diagnosis not present

## 2020-04-16 DIAGNOSIS — M47817 Spondylosis without myelopathy or radiculopathy, lumbosacral region: Secondary | ICD-10-CM | POA: Diagnosis not present

## 2020-04-16 DIAGNOSIS — Z79899 Other long term (current) drug therapy: Secondary | ICD-10-CM | POA: Diagnosis not present

## 2020-04-16 DIAGNOSIS — M47812 Spondylosis without myelopathy or radiculopathy, cervical region: Secondary | ICD-10-CM | POA: Diagnosis not present

## 2020-04-30 ENCOUNTER — Ambulatory Visit: Payer: Medicare Other

## 2020-05-21 DIAGNOSIS — M67912 Unspecified disorder of synovium and tendon, left shoulder: Secondary | ICD-10-CM | POA: Diagnosis not present

## 2020-05-21 DIAGNOSIS — M25512 Pain in left shoulder: Secondary | ICD-10-CM | POA: Diagnosis not present

## 2020-05-22 DIAGNOSIS — E782 Mixed hyperlipidemia: Secondary | ICD-10-CM | POA: Diagnosis not present

## 2020-05-22 DIAGNOSIS — R7303 Prediabetes: Secondary | ICD-10-CM | POA: Diagnosis not present

## 2020-05-22 DIAGNOSIS — Z0000 Encounter for general adult medical examination without abnormal findings: Secondary | ICD-10-CM | POA: Diagnosis not present

## 2020-05-22 DIAGNOSIS — G250 Essential tremor: Secondary | ICD-10-CM | POA: Diagnosis not present

## 2020-05-22 DIAGNOSIS — G43909 Migraine, unspecified, not intractable, without status migrainosus: Secondary | ICD-10-CM | POA: Diagnosis not present

## 2020-05-22 DIAGNOSIS — M797 Fibromyalgia: Secondary | ICD-10-CM | POA: Diagnosis not present

## 2020-05-22 DIAGNOSIS — I10 Essential (primary) hypertension: Secondary | ICD-10-CM | POA: Diagnosis not present

## 2020-06-18 ENCOUNTER — Ambulatory Visit
Admission: RE | Admit: 2020-06-18 | Discharge: 2020-06-18 | Disposition: A | Discharge status: Home / Self Care | Payer: Medicare Other | Source: Ambulatory Visit | Attending: Family Medicine | Admitting: Family Medicine

## 2020-06-18 ENCOUNTER — Other Ambulatory Visit: Payer: Self-pay

## 2020-06-18 DIAGNOSIS — Z1231 Encounter for screening mammogram for malignant neoplasm of breast: Secondary | ICD-10-CM | POA: Diagnosis not present

## 2020-06-18 DIAGNOSIS — Z0000 Encounter for general adult medical examination without abnormal findings: Secondary | ICD-10-CM

## 2020-06-25 DIAGNOSIS — G43019 Migraine without aura, intractable, without status migrainosus: Secondary | ICD-10-CM | POA: Diagnosis not present

## 2020-06-25 DIAGNOSIS — R519 Headache, unspecified: Secondary | ICD-10-CM | POA: Diagnosis not present

## 2020-06-25 DIAGNOSIS — G43719 Chronic migraine without aura, intractable, without status migrainosus: Secondary | ICD-10-CM | POA: Diagnosis not present

## 2020-07-02 DIAGNOSIS — Z79899 Other long term (current) drug therapy: Secondary | ICD-10-CM | POA: Diagnosis not present

## 2020-07-02 DIAGNOSIS — M47812 Spondylosis without myelopathy or radiculopathy, cervical region: Secondary | ICD-10-CM | POA: Diagnosis not present

## 2020-07-02 DIAGNOSIS — M47817 Spondylosis without myelopathy or radiculopathy, lumbosacral region: Secondary | ICD-10-CM | POA: Diagnosis not present

## 2020-07-02 DIAGNOSIS — M47816 Spondylosis without myelopathy or radiculopathy, lumbar region: Secondary | ICD-10-CM | POA: Diagnosis not present

## 2020-07-02 DIAGNOSIS — G4733 Obstructive sleep apnea (adult) (pediatric): Secondary | ICD-10-CM | POA: Diagnosis not present

## 2020-07-02 DIAGNOSIS — M461 Sacroiliitis, not elsewhere classified: Secondary | ICD-10-CM | POA: Diagnosis not present

## 2020-07-02 DIAGNOSIS — M791 Myalgia, unspecified site: Secondary | ICD-10-CM | POA: Diagnosis not present

## 2020-08-13 DIAGNOSIS — M7712 Lateral epicondylitis, left elbow: Secondary | ICD-10-CM | POA: Diagnosis not present

## 2020-09-05 DIAGNOSIS — H40013 Open angle with borderline findings, low risk, bilateral: Secondary | ICD-10-CM | POA: Diagnosis not present

## 2020-09-05 DIAGNOSIS — H524 Presbyopia: Secondary | ICD-10-CM | POA: Diagnosis not present

## 2020-09-06 DIAGNOSIS — E782 Mixed hyperlipidemia: Secondary | ICD-10-CM | POA: Diagnosis not present

## 2020-09-06 DIAGNOSIS — H35033 Hypertensive retinopathy, bilateral: Secondary | ICD-10-CM | POA: Diagnosis not present

## 2020-09-06 DIAGNOSIS — I10 Essential (primary) hypertension: Secondary | ICD-10-CM | POA: Diagnosis not present

## 2020-09-10 DIAGNOSIS — M7712 Lateral epicondylitis, left elbow: Secondary | ICD-10-CM | POA: Diagnosis not present

## 2020-09-10 DIAGNOSIS — M79602 Pain in left arm: Secondary | ICD-10-CM | POA: Diagnosis not present

## 2020-10-10 DIAGNOSIS — G4733 Obstructive sleep apnea (adult) (pediatric): Secondary | ICD-10-CM | POA: Diagnosis not present

## 2020-10-10 DIAGNOSIS — M47812 Spondylosis without myelopathy or radiculopathy, cervical region: Secondary | ICD-10-CM | POA: Diagnosis not present

## 2020-10-10 DIAGNOSIS — M791 Myalgia, unspecified site: Secondary | ICD-10-CM | POA: Diagnosis not present

## 2020-10-10 DIAGNOSIS — Z79899 Other long term (current) drug therapy: Secondary | ICD-10-CM | POA: Diagnosis not present

## 2020-10-10 DIAGNOSIS — M47816 Spondylosis without myelopathy or radiculopathy, lumbar region: Secondary | ICD-10-CM | POA: Diagnosis not present

## 2020-10-10 DIAGNOSIS — M461 Sacroiliitis, not elsewhere classified: Secondary | ICD-10-CM | POA: Diagnosis not present

## 2020-10-22 ENCOUNTER — Other Ambulatory Visit: Payer: Self-pay | Admitting: Orthopedic Surgery

## 2020-10-22 DIAGNOSIS — M25522 Pain in left elbow: Secondary | ICD-10-CM

## 2020-10-22 DIAGNOSIS — M7712 Lateral epicondylitis, left elbow: Secondary | ICD-10-CM | POA: Diagnosis not present

## 2020-11-16 ENCOUNTER — Other Ambulatory Visit: Payer: Self-pay

## 2020-11-16 ENCOUNTER — Ambulatory Visit
Admission: RE | Admit: 2020-11-16 | Discharge: 2020-11-16 | Disposition: A | Discharge status: Home / Self Care | Payer: Medicare Other | Source: Ambulatory Visit | Attending: Orthopedic Surgery | Admitting: Orthopedic Surgery

## 2020-11-16 DIAGNOSIS — M25522 Pain in left elbow: Secondary | ICD-10-CM

## 2020-11-16 DIAGNOSIS — M7712 Lateral epicondylitis, left elbow: Secondary | ICD-10-CM

## 2020-11-16 DIAGNOSIS — S56512A Strain of other extensor muscle, fascia and tendon at forearm level, left arm, initial encounter: Secondary | ICD-10-CM | POA: Diagnosis not present

## 2020-11-21 DIAGNOSIS — M7712 Lateral epicondylitis, left elbow: Secondary | ICD-10-CM | POA: Diagnosis not present

## 2020-11-21 DIAGNOSIS — M79602 Pain in left arm: Secondary | ICD-10-CM | POA: Diagnosis not present

## 2020-11-25 DIAGNOSIS — I10 Essential (primary) hypertension: Secondary | ICD-10-CM | POA: Diagnosis not present

## 2020-11-25 DIAGNOSIS — E782 Mixed hyperlipidemia: Secondary | ICD-10-CM | POA: Diagnosis not present

## 2020-11-25 DIAGNOSIS — R7303 Prediabetes: Secondary | ICD-10-CM | POA: Diagnosis not present

## 2020-11-25 DIAGNOSIS — M797 Fibromyalgia: Secondary | ICD-10-CM | POA: Diagnosis not present

## 2020-11-25 DIAGNOSIS — G43909 Migraine, unspecified, not intractable, without status migrainosus: Secondary | ICD-10-CM | POA: Diagnosis not present

## 2020-11-25 DIAGNOSIS — G250 Essential tremor: Secondary | ICD-10-CM | POA: Diagnosis not present

## 2020-12-19 DIAGNOSIS — M25522 Pain in left elbow: Secondary | ICD-10-CM | POA: Diagnosis not present

## 2020-12-24 DIAGNOSIS — R519 Headache, unspecified: Secondary | ICD-10-CM | POA: Diagnosis not present

## 2020-12-24 DIAGNOSIS — G43719 Chronic migraine without aura, intractable, without status migrainosus: Secondary | ICD-10-CM | POA: Diagnosis not present

## 2020-12-24 DIAGNOSIS — G43019 Migraine without aura, intractable, without status migrainosus: Secondary | ICD-10-CM | POA: Diagnosis not present

## 2020-12-31 DIAGNOSIS — M47817 Spondylosis without myelopathy or radiculopathy, lumbosacral region: Secondary | ICD-10-CM | POA: Diagnosis not present

## 2020-12-31 DIAGNOSIS — M47812 Spondylosis without myelopathy or radiculopathy, cervical region: Secondary | ICD-10-CM | POA: Diagnosis not present

## 2020-12-31 DIAGNOSIS — M47816 Spondylosis without myelopathy or radiculopathy, lumbar region: Secondary | ICD-10-CM | POA: Diagnosis not present

## 2020-12-31 DIAGNOSIS — M461 Sacroiliitis, not elsewhere classified: Secondary | ICD-10-CM | POA: Diagnosis not present

## 2020-12-31 DIAGNOSIS — M542 Cervicalgia: Secondary | ICD-10-CM | POA: Diagnosis not present

## 2020-12-31 DIAGNOSIS — Z79899 Other long term (current) drug therapy: Secondary | ICD-10-CM | POA: Diagnosis not present

## 2020-12-31 DIAGNOSIS — M791 Myalgia, unspecified site: Secondary | ICD-10-CM | POA: Diagnosis not present

## 2021-02-02 DIAGNOSIS — M7712 Lateral epicondylitis, left elbow: Secondary | ICD-10-CM | POA: Diagnosis not present

## 2021-02-02 DIAGNOSIS — M659 Synovitis and tenosynovitis, unspecified: Secondary | ICD-10-CM | POA: Diagnosis not present

## 2021-02-03 DIAGNOSIS — M7712 Lateral epicondylitis, left elbow: Secondary | ICD-10-CM | POA: Diagnosis not present

## 2021-02-03 DIAGNOSIS — M19022 Primary osteoarthritis, left elbow: Secondary | ICD-10-CM | POA: Diagnosis not present

## 2021-02-03 DIAGNOSIS — M659 Synovitis and tenosynovitis, unspecified: Secondary | ICD-10-CM | POA: Diagnosis not present

## 2021-03-20 DIAGNOSIS — M67911 Unspecified disorder of synovium and tendon, right shoulder: Secondary | ICD-10-CM | POA: Diagnosis not present

## 2021-04-10 DIAGNOSIS — M791 Myalgia, unspecified site: Secondary | ICD-10-CM | POA: Diagnosis not present

## 2021-04-10 DIAGNOSIS — M609 Myositis, unspecified: Secondary | ICD-10-CM | POA: Diagnosis not present

## 2021-04-10 DIAGNOSIS — Z79899 Other long term (current) drug therapy: Secondary | ICD-10-CM | POA: Diagnosis not present

## 2021-04-10 DIAGNOSIS — M542 Cervicalgia: Secondary | ICD-10-CM | POA: Diagnosis not present

## 2021-04-10 DIAGNOSIS — M47812 Spondylosis without myelopathy or radiculopathy, cervical region: Secondary | ICD-10-CM | POA: Diagnosis not present

## 2021-04-10 DIAGNOSIS — M47817 Spondylosis without myelopathy or radiculopathy, lumbosacral region: Secondary | ICD-10-CM | POA: Diagnosis not present

## 2021-04-10 DIAGNOSIS — M47816 Spondylosis without myelopathy or radiculopathy, lumbar region: Secondary | ICD-10-CM | POA: Diagnosis not present

## 2021-04-10 DIAGNOSIS — G4733 Obstructive sleep apnea (adult) (pediatric): Secondary | ICD-10-CM | POA: Diagnosis not present

## 2021-05-01 DIAGNOSIS — M25511 Pain in right shoulder: Secondary | ICD-10-CM | POA: Diagnosis not present

## 2021-05-06 DIAGNOSIS — H31011 Macula scars of posterior pole (postinflammatory) (post-traumatic), right eye: Secondary | ICD-10-CM | POA: Diagnosis not present

## 2021-05-06 DIAGNOSIS — R7309 Other abnormal glucose: Secondary | ICD-10-CM | POA: Diagnosis not present

## 2021-05-06 DIAGNOSIS — H40013 Open angle with borderline findings, low risk, bilateral: Secondary | ICD-10-CM | POA: Diagnosis not present

## 2021-05-06 DIAGNOSIS — H47291 Other optic atrophy, right eye: Secondary | ICD-10-CM | POA: Diagnosis not present

## 2021-06-02 ENCOUNTER — Other Ambulatory Visit: Payer: Self-pay | Admitting: Family Medicine

## 2021-06-02 DIAGNOSIS — E782 Mixed hyperlipidemia: Secondary | ICD-10-CM | POA: Diagnosis not present

## 2021-06-02 DIAGNOSIS — R7303 Prediabetes: Secondary | ICD-10-CM | POA: Diagnosis not present

## 2021-06-02 DIAGNOSIS — G250 Essential tremor: Secondary | ICD-10-CM | POA: Diagnosis not present

## 2021-06-02 DIAGNOSIS — G43909 Migraine, unspecified, not intractable, without status migrainosus: Secondary | ICD-10-CM | POA: Diagnosis not present

## 2021-06-02 DIAGNOSIS — M797 Fibromyalgia: Secondary | ICD-10-CM | POA: Diagnosis not present

## 2021-06-02 DIAGNOSIS — Z1231 Encounter for screening mammogram for malignant neoplasm of breast: Secondary | ICD-10-CM

## 2021-06-02 DIAGNOSIS — I10 Essential (primary) hypertension: Secondary | ICD-10-CM | POA: Diagnosis not present

## 2021-06-02 DIAGNOSIS — Z0000 Encounter for general adult medical examination without abnormal findings: Secondary | ICD-10-CM | POA: Diagnosis not present

## 2021-06-03 ENCOUNTER — Other Ambulatory Visit: Payer: Self-pay

## 2021-06-03 DIAGNOSIS — M25511 Pain in right shoulder: Secondary | ICD-10-CM

## 2021-06-03 DIAGNOSIS — M67911 Unspecified disorder of synovium and tendon, right shoulder: Secondary | ICD-10-CM | POA: Diagnosis not present

## 2021-06-20 ENCOUNTER — Ambulatory Visit
Admission: RE | Admit: 2021-06-20 | Discharge: 2021-06-20 | Disposition: A | Discharge status: Home / Self Care | Payer: Medicare Other | Source: Ambulatory Visit | Attending: Family Medicine | Admitting: Family Medicine

## 2021-06-20 DIAGNOSIS — Z1231 Encounter for screening mammogram for malignant neoplasm of breast: Secondary | ICD-10-CM

## 2021-06-21 ENCOUNTER — Ambulatory Visit
Admission: RE | Admit: 2021-06-21 | Discharge: 2021-06-21 | Disposition: A | Discharge status: Home / Self Care | Payer: Medicare Other | Source: Ambulatory Visit | Attending: Orthopedic Surgery | Admitting: Orthopedic Surgery

## 2021-06-21 DIAGNOSIS — M25511 Pain in right shoulder: Secondary | ICD-10-CM | POA: Diagnosis not present

## 2021-06-24 DIAGNOSIS — G43019 Migraine without aura, intractable, without status migrainosus: Secondary | ICD-10-CM | POA: Diagnosis not present

## 2021-06-24 DIAGNOSIS — G43719 Chronic migraine without aura, intractable, without status migrainosus: Secondary | ICD-10-CM | POA: Diagnosis not present

## 2021-06-24 DIAGNOSIS — R519 Headache, unspecified: Secondary | ICD-10-CM | POA: Diagnosis not present

## 2021-06-26 DIAGNOSIS — M75121 Complete rotator cuff tear or rupture of right shoulder, not specified as traumatic: Secondary | ICD-10-CM | POA: Diagnosis not present

## 2021-06-26 DIAGNOSIS — M67911 Unspecified disorder of synovium and tendon, right shoulder: Secondary | ICD-10-CM | POA: Diagnosis not present

## 2021-06-26 DIAGNOSIS — M25511 Pain in right shoulder: Secondary | ICD-10-CM | POA: Diagnosis not present

## 2021-07-21 DIAGNOSIS — M7521 Bicipital tendinitis, right shoulder: Secondary | ICD-10-CM | POA: Diagnosis not present

## 2021-07-21 DIAGNOSIS — S46011A Strain of muscle(s) and tendon(s) of the rotator cuff of right shoulder, initial encounter: Secondary | ICD-10-CM | POA: Diagnosis not present

## 2021-07-21 DIAGNOSIS — M7541 Impingement syndrome of right shoulder: Secondary | ICD-10-CM | POA: Diagnosis not present

## 2021-07-21 DIAGNOSIS — M19011 Primary osteoarthritis, right shoulder: Secondary | ICD-10-CM | POA: Diagnosis not present

## 2021-07-21 DIAGNOSIS — M67813 Other specified disorders of tendon, right shoulder: Secondary | ICD-10-CM | POA: Diagnosis not present

## 2021-08-05 DIAGNOSIS — M25611 Stiffness of right shoulder, not elsewhere classified: Secondary | ICD-10-CM | POA: Diagnosis not present

## 2021-08-05 DIAGNOSIS — M75111 Incomplete rotator cuff tear or rupture of right shoulder, not specified as traumatic: Secondary | ICD-10-CM | POA: Diagnosis not present

## 2021-08-07 DIAGNOSIS — M75111 Incomplete rotator cuff tear or rupture of right shoulder, not specified as traumatic: Secondary | ICD-10-CM | POA: Diagnosis not present

## 2021-08-07 DIAGNOSIS — M25611 Stiffness of right shoulder, not elsewhere classified: Secondary | ICD-10-CM | POA: Diagnosis not present

## 2021-08-11 DIAGNOSIS — M47812 Spondylosis without myelopathy or radiculopathy, cervical region: Secondary | ICD-10-CM | POA: Diagnosis not present

## 2021-08-11 DIAGNOSIS — M47816 Spondylosis without myelopathy or radiculopathy, lumbar region: Secondary | ICD-10-CM | POA: Diagnosis not present

## 2021-08-11 DIAGNOSIS — G4733 Obstructive sleep apnea (adult) (pediatric): Secondary | ICD-10-CM | POA: Diagnosis not present

## 2021-08-12 DIAGNOSIS — M25611 Stiffness of right shoulder, not elsewhere classified: Secondary | ICD-10-CM | POA: Diagnosis not present

## 2021-08-12 DIAGNOSIS — M75111 Incomplete rotator cuff tear or rupture of right shoulder, not specified as traumatic: Secondary | ICD-10-CM | POA: Diagnosis not present

## 2021-08-14 DIAGNOSIS — M25611 Stiffness of right shoulder, not elsewhere classified: Secondary | ICD-10-CM | POA: Diagnosis not present

## 2021-08-14 DIAGNOSIS — M75111 Incomplete rotator cuff tear or rupture of right shoulder, not specified as traumatic: Secondary | ICD-10-CM | POA: Diagnosis not present

## 2021-08-19 DIAGNOSIS — M75111 Incomplete rotator cuff tear or rupture of right shoulder, not specified as traumatic: Secondary | ICD-10-CM | POA: Diagnosis not present

## 2021-08-19 DIAGNOSIS — M25611 Stiffness of right shoulder, not elsewhere classified: Secondary | ICD-10-CM | POA: Diagnosis not present

## 2021-08-21 DIAGNOSIS — M25611 Stiffness of right shoulder, not elsewhere classified: Secondary | ICD-10-CM | POA: Diagnosis not present

## 2021-08-21 DIAGNOSIS — M75111 Incomplete rotator cuff tear or rupture of right shoulder, not specified as traumatic: Secondary | ICD-10-CM | POA: Diagnosis not present

## 2021-08-26 DIAGNOSIS — M25611 Stiffness of right shoulder, not elsewhere classified: Secondary | ICD-10-CM | POA: Diagnosis not present

## 2021-08-26 DIAGNOSIS — M75111 Incomplete rotator cuff tear or rupture of right shoulder, not specified as traumatic: Secondary | ICD-10-CM | POA: Diagnosis not present

## 2021-08-28 DIAGNOSIS — M75111 Incomplete rotator cuff tear or rupture of right shoulder, not specified as traumatic: Secondary | ICD-10-CM | POA: Diagnosis not present

## 2021-08-28 DIAGNOSIS — M25611 Stiffness of right shoulder, not elsewhere classified: Secondary | ICD-10-CM | POA: Diagnosis not present

## 2021-09-02 DIAGNOSIS — M25611 Stiffness of right shoulder, not elsewhere classified: Secondary | ICD-10-CM | POA: Diagnosis not present

## 2021-09-02 DIAGNOSIS — M75111 Incomplete rotator cuff tear or rupture of right shoulder, not specified as traumatic: Secondary | ICD-10-CM | POA: Diagnosis not present

## 2021-09-04 DIAGNOSIS — M25611 Stiffness of right shoulder, not elsewhere classified: Secondary | ICD-10-CM | POA: Diagnosis not present

## 2021-09-04 DIAGNOSIS — M75111 Incomplete rotator cuff tear or rupture of right shoulder, not specified as traumatic: Secondary | ICD-10-CM | POA: Diagnosis not present

## 2021-09-09 DIAGNOSIS — M75111 Incomplete rotator cuff tear or rupture of right shoulder, not specified as traumatic: Secondary | ICD-10-CM | POA: Diagnosis not present

## 2021-09-09 DIAGNOSIS — M25611 Stiffness of right shoulder, not elsewhere classified: Secondary | ICD-10-CM | POA: Diagnosis not present

## 2021-09-11 DIAGNOSIS — M25611 Stiffness of right shoulder, not elsewhere classified: Secondary | ICD-10-CM | POA: Diagnosis not present

## 2021-09-11 DIAGNOSIS — M75111 Incomplete rotator cuff tear or rupture of right shoulder, not specified as traumatic: Secondary | ICD-10-CM | POA: Diagnosis not present

## 2021-09-16 DIAGNOSIS — M25611 Stiffness of right shoulder, not elsewhere classified: Secondary | ICD-10-CM | POA: Diagnosis not present

## 2021-09-16 DIAGNOSIS — M75111 Incomplete rotator cuff tear or rupture of right shoulder, not specified as traumatic: Secondary | ICD-10-CM | POA: Diagnosis not present

## 2021-09-18 DIAGNOSIS — M75111 Incomplete rotator cuff tear or rupture of right shoulder, not specified as traumatic: Secondary | ICD-10-CM | POA: Diagnosis not present

## 2021-09-18 DIAGNOSIS — M25611 Stiffness of right shoulder, not elsewhere classified: Secondary | ICD-10-CM | POA: Diagnosis not present

## 2021-09-23 DIAGNOSIS — M25611 Stiffness of right shoulder, not elsewhere classified: Secondary | ICD-10-CM | POA: Diagnosis not present

## 2021-09-23 DIAGNOSIS — M75111 Incomplete rotator cuff tear or rupture of right shoulder, not specified as traumatic: Secondary | ICD-10-CM | POA: Diagnosis not present

## 2021-09-25 DIAGNOSIS — M75111 Incomplete rotator cuff tear or rupture of right shoulder, not specified as traumatic: Secondary | ICD-10-CM | POA: Diagnosis not present

## 2021-09-25 DIAGNOSIS — M25611 Stiffness of right shoulder, not elsewhere classified: Secondary | ICD-10-CM | POA: Diagnosis not present

## 2021-09-30 DIAGNOSIS — M75111 Incomplete rotator cuff tear or rupture of right shoulder, not specified as traumatic: Secondary | ICD-10-CM | POA: Diagnosis not present

## 2021-09-30 DIAGNOSIS — M25611 Stiffness of right shoulder, not elsewhere classified: Secondary | ICD-10-CM | POA: Diagnosis not present

## 2021-10-02 DIAGNOSIS — M75111 Incomplete rotator cuff tear or rupture of right shoulder, not specified as traumatic: Secondary | ICD-10-CM | POA: Diagnosis not present

## 2021-10-02 DIAGNOSIS — M25611 Stiffness of right shoulder, not elsewhere classified: Secondary | ICD-10-CM | POA: Diagnosis not present

## 2021-10-07 DIAGNOSIS — M25611 Stiffness of right shoulder, not elsewhere classified: Secondary | ICD-10-CM | POA: Diagnosis not present

## 2021-10-07 DIAGNOSIS — M75111 Incomplete rotator cuff tear or rupture of right shoulder, not specified as traumatic: Secondary | ICD-10-CM | POA: Diagnosis not present

## 2021-10-09 DIAGNOSIS — M25611 Stiffness of right shoulder, not elsewhere classified: Secondary | ICD-10-CM | POA: Diagnosis not present

## 2021-10-09 DIAGNOSIS — M75111 Incomplete rotator cuff tear or rupture of right shoulder, not specified as traumatic: Secondary | ICD-10-CM | POA: Diagnosis not present

## 2021-10-14 DIAGNOSIS — M25611 Stiffness of right shoulder, not elsewhere classified: Secondary | ICD-10-CM | POA: Diagnosis not present

## 2021-10-14 DIAGNOSIS — M75111 Incomplete rotator cuff tear or rupture of right shoulder, not specified as traumatic: Secondary | ICD-10-CM | POA: Diagnosis not present

## 2021-10-17 DIAGNOSIS — M25611 Stiffness of right shoulder, not elsewhere classified: Secondary | ICD-10-CM | POA: Diagnosis not present

## 2021-10-17 DIAGNOSIS — M75111 Incomplete rotator cuff tear or rupture of right shoulder, not specified as traumatic: Secondary | ICD-10-CM | POA: Diagnosis not present

## 2021-10-20 DIAGNOSIS — M25611 Stiffness of right shoulder, not elsewhere classified: Secondary | ICD-10-CM | POA: Diagnosis not present

## 2021-10-27 DIAGNOSIS — M47812 Spondylosis without myelopathy or radiculopathy, cervical region: Secondary | ICD-10-CM | POA: Diagnosis not present

## 2021-10-27 DIAGNOSIS — M797 Fibromyalgia: Secondary | ICD-10-CM | POA: Diagnosis not present

## 2021-10-27 DIAGNOSIS — G4733 Obstructive sleep apnea (adult) (pediatric): Secondary | ICD-10-CM | POA: Diagnosis not present

## 2021-10-27 DIAGNOSIS — M47816 Spondylosis without myelopathy or radiculopathy, lumbar region: Secondary | ICD-10-CM | POA: Diagnosis not present

## 2021-11-11 DIAGNOSIS — M25611 Stiffness of right shoulder, not elsewhere classified: Secondary | ICD-10-CM | POA: Diagnosis not present

## 2021-11-11 DIAGNOSIS — M75111 Incomplete rotator cuff tear or rupture of right shoulder, not specified as traumatic: Secondary | ICD-10-CM | POA: Diagnosis not present

## 2021-11-13 DIAGNOSIS — M75111 Incomplete rotator cuff tear or rupture of right shoulder, not specified as traumatic: Secondary | ICD-10-CM | POA: Diagnosis not present

## 2021-11-13 DIAGNOSIS — M25611 Stiffness of right shoulder, not elsewhere classified: Secondary | ICD-10-CM | POA: Diagnosis not present

## 2021-11-17 DIAGNOSIS — M25511 Pain in right shoulder: Secondary | ICD-10-CM | POA: Diagnosis not present

## 2021-11-18 DIAGNOSIS — M25611 Stiffness of right shoulder, not elsewhere classified: Secondary | ICD-10-CM | POA: Diagnosis not present

## 2021-11-18 DIAGNOSIS — M75111 Incomplete rotator cuff tear or rupture of right shoulder, not specified as traumatic: Secondary | ICD-10-CM | POA: Diagnosis not present

## 2021-11-21 DIAGNOSIS — M25611 Stiffness of right shoulder, not elsewhere classified: Secondary | ICD-10-CM | POA: Diagnosis not present

## 2021-11-21 DIAGNOSIS — M75111 Incomplete rotator cuff tear or rupture of right shoulder, not specified as traumatic: Secondary | ICD-10-CM | POA: Diagnosis not present

## 2021-11-24 DIAGNOSIS — M75111 Incomplete rotator cuff tear or rupture of right shoulder, not specified as traumatic: Secondary | ICD-10-CM | POA: Diagnosis not present

## 2021-11-24 DIAGNOSIS — M25611 Stiffness of right shoulder, not elsewhere classified: Secondary | ICD-10-CM | POA: Diagnosis not present

## 2021-11-26 DIAGNOSIS — M75111 Incomplete rotator cuff tear or rupture of right shoulder, not specified as traumatic: Secondary | ICD-10-CM | POA: Diagnosis not present

## 2021-11-26 DIAGNOSIS — M25611 Stiffness of right shoulder, not elsewhere classified: Secondary | ICD-10-CM | POA: Diagnosis not present

## 2021-12-01 DIAGNOSIS — H40013 Open angle with borderline findings, low risk, bilateral: Secondary | ICD-10-CM | POA: Diagnosis not present

## 2021-12-01 DIAGNOSIS — H43813 Vitreous degeneration, bilateral: Secondary | ICD-10-CM | POA: Diagnosis not present

## 2021-12-09 DIAGNOSIS — M75111 Incomplete rotator cuff tear or rupture of right shoulder, not specified as traumatic: Secondary | ICD-10-CM | POA: Diagnosis not present

## 2021-12-09 DIAGNOSIS — M25611 Stiffness of right shoulder, not elsewhere classified: Secondary | ICD-10-CM | POA: Diagnosis not present

## 2021-12-11 DIAGNOSIS — M75111 Incomplete rotator cuff tear or rupture of right shoulder, not specified as traumatic: Secondary | ICD-10-CM | POA: Diagnosis not present

## 2021-12-11 DIAGNOSIS — M25611 Stiffness of right shoulder, not elsewhere classified: Secondary | ICD-10-CM | POA: Diagnosis not present

## 2021-12-16 DIAGNOSIS — M25611 Stiffness of right shoulder, not elsewhere classified: Secondary | ICD-10-CM | POA: Diagnosis not present

## 2021-12-16 DIAGNOSIS — M75111 Incomplete rotator cuff tear or rupture of right shoulder, not specified as traumatic: Secondary | ICD-10-CM | POA: Diagnosis not present

## 2021-12-18 DIAGNOSIS — M25611 Stiffness of right shoulder, not elsewhere classified: Secondary | ICD-10-CM | POA: Diagnosis not present

## 2021-12-18 DIAGNOSIS — M75111 Incomplete rotator cuff tear or rupture of right shoulder, not specified as traumatic: Secondary | ICD-10-CM | POA: Diagnosis not present

## 2021-12-23 DIAGNOSIS — I10 Essential (primary) hypertension: Secondary | ICD-10-CM | POA: Diagnosis not present

## 2021-12-23 DIAGNOSIS — G8929 Other chronic pain: Secondary | ICD-10-CM | POA: Diagnosis not present

## 2021-12-23 DIAGNOSIS — G250 Essential tremor: Secondary | ICD-10-CM | POA: Diagnosis not present

## 2021-12-23 DIAGNOSIS — M797 Fibromyalgia: Secondary | ICD-10-CM | POA: Diagnosis not present

## 2021-12-23 DIAGNOSIS — G43909 Migraine, unspecified, not intractable, without status migrainosus: Secondary | ICD-10-CM | POA: Diagnosis not present

## 2021-12-23 DIAGNOSIS — E782 Mixed hyperlipidemia: Secondary | ICD-10-CM | POA: Diagnosis not present

## 2021-12-23 DIAGNOSIS — R7303 Prediabetes: Secondary | ICD-10-CM | POA: Diagnosis not present

## 2021-12-23 DIAGNOSIS — M545 Low back pain, unspecified: Secondary | ICD-10-CM | POA: Diagnosis not present

## 2021-12-24 DIAGNOSIS — M47816 Spondylosis without myelopathy or radiculopathy, lumbar region: Secondary | ICD-10-CM | POA: Diagnosis not present

## 2021-12-24 DIAGNOSIS — M797 Fibromyalgia: Secondary | ICD-10-CM | POA: Diagnosis not present

## 2021-12-24 DIAGNOSIS — M47812 Spondylosis without myelopathy or radiculopathy, cervical region: Secondary | ICD-10-CM | POA: Diagnosis not present

## 2021-12-25 DIAGNOSIS — G43719 Chronic migraine without aura, intractable, without status migrainosus: Secondary | ICD-10-CM | POA: Diagnosis not present

## 2021-12-29 DIAGNOSIS — M25511 Pain in right shoulder: Secondary | ICD-10-CM | POA: Diagnosis not present

## 2021-12-29 DIAGNOSIS — M67921 Unspecified disorder of synovium and tendon, right upper arm: Secondary | ICD-10-CM | POA: Diagnosis not present

## 2022-02-17 DIAGNOSIS — M25511 Pain in right shoulder: Secondary | ICD-10-CM | POA: Diagnosis not present

## 2022-03-05 DIAGNOSIS — M47812 Spondylosis without myelopathy or radiculopathy, cervical region: Secondary | ICD-10-CM | POA: Diagnosis not present

## 2022-03-05 DIAGNOSIS — M797 Fibromyalgia: Secondary | ICD-10-CM | POA: Diagnosis not present

## 2022-03-05 DIAGNOSIS — M47816 Spondylosis without myelopathy or radiculopathy, lumbar region: Secondary | ICD-10-CM | POA: Diagnosis not present

## 2022-03-05 DIAGNOSIS — M25511 Pain in right shoulder: Secondary | ICD-10-CM | POA: Diagnosis not present

## 2022-03-17 DIAGNOSIS — M25511 Pain in right shoulder: Secondary | ICD-10-CM | POA: Diagnosis not present

## 2022-03-17 DIAGNOSIS — M542 Cervicalgia: Secondary | ICD-10-CM | POA: Diagnosis not present

## 2022-04-27 DIAGNOSIS — M25511 Pain in right shoulder: Secondary | ICD-10-CM | POA: Diagnosis not present

## 2022-04-28 ENCOUNTER — Other Ambulatory Visit: Payer: Self-pay | Admitting: Orthopedic Surgery

## 2022-04-28 DIAGNOSIS — M25511 Pain in right shoulder: Secondary | ICD-10-CM

## 2022-05-08 ENCOUNTER — Other Ambulatory Visit: Payer: Self-pay | Admitting: Family Medicine

## 2022-05-08 DIAGNOSIS — Z1231 Encounter for screening mammogram for malignant neoplasm of breast: Secondary | ICD-10-CM

## 2022-05-09 ENCOUNTER — Ambulatory Visit
Admission: RE | Admit: 2022-05-09 | Discharge: 2022-05-09 | Disposition: A | Discharge status: Home / Self Care | Payer: Medicare Other | Source: Ambulatory Visit | Attending: Orthopedic Surgery | Admitting: Orthopedic Surgery

## 2022-05-09 DIAGNOSIS — M25511 Pain in right shoulder: Secondary | ICD-10-CM | POA: Diagnosis not present

## 2022-05-14 DIAGNOSIS — M797 Fibromyalgia: Secondary | ICD-10-CM | POA: Diagnosis not present

## 2022-05-14 DIAGNOSIS — M47816 Spondylosis without myelopathy or radiculopathy, lumbar region: Secondary | ICD-10-CM | POA: Diagnosis not present

## 2022-05-14 DIAGNOSIS — M25511 Pain in right shoulder: Secondary | ICD-10-CM | POA: Diagnosis not present

## 2022-05-14 DIAGNOSIS — M47812 Spondylosis without myelopathy or radiculopathy, cervical region: Secondary | ICD-10-CM | POA: Diagnosis not present

## 2022-05-21 DIAGNOSIS — M75121 Complete rotator cuff tear or rupture of right shoulder, not specified as traumatic: Secondary | ICD-10-CM | POA: Diagnosis not present

## 2022-05-27 DIAGNOSIS — M75121 Complete rotator cuff tear or rupture of right shoulder, not specified as traumatic: Secondary | ICD-10-CM | POA: Diagnosis not present

## 2022-06-11 DIAGNOSIS — R7309 Other abnormal glucose: Secondary | ICD-10-CM | POA: Diagnosis not present

## 2022-06-11 DIAGNOSIS — H40013 Open angle with borderline findings, low risk, bilateral: Secondary | ICD-10-CM | POA: Diagnosis not present

## 2022-06-11 DIAGNOSIS — H524 Presbyopia: Secondary | ICD-10-CM | POA: Diagnosis not present

## 2022-06-11 DIAGNOSIS — H31011 Macula scars of posterior pole (postinflammatory) (post-traumatic), right eye: Secondary | ICD-10-CM | POA: Diagnosis not present

## 2022-06-11 DIAGNOSIS — H35361 Drusen (degenerative) of macula, right eye: Secondary | ICD-10-CM | POA: Diagnosis not present

## 2022-06-11 DIAGNOSIS — H47291 Other optic atrophy, right eye: Secondary | ICD-10-CM | POA: Diagnosis not present

## 2022-06-11 DIAGNOSIS — H25813 Combined forms of age-related cataract, bilateral: Secondary | ICD-10-CM | POA: Diagnosis not present

## 2022-06-23 DIAGNOSIS — G43719 Chronic migraine without aura, intractable, without status migrainosus: Secondary | ICD-10-CM | POA: Diagnosis not present

## 2022-06-29 DIAGNOSIS — M25511 Pain in right shoulder: Secondary | ICD-10-CM | POA: Diagnosis not present

## 2022-06-29 DIAGNOSIS — Z0000 Encounter for general adult medical examination without abnormal findings: Secondary | ICD-10-CM | POA: Diagnosis not present

## 2022-06-29 DIAGNOSIS — M797 Fibromyalgia: Secondary | ICD-10-CM | POA: Diagnosis not present

## 2022-06-29 DIAGNOSIS — R7303 Prediabetes: Secondary | ICD-10-CM | POA: Diagnosis not present

## 2022-06-29 DIAGNOSIS — G8929 Other chronic pain: Secondary | ICD-10-CM | POA: Diagnosis not present

## 2022-06-29 DIAGNOSIS — E782 Mixed hyperlipidemia: Secondary | ICD-10-CM | POA: Diagnosis not present

## 2022-06-29 DIAGNOSIS — G43909 Migraine, unspecified, not intractable, without status migrainosus: Secondary | ICD-10-CM | POA: Diagnosis not present

## 2022-06-29 DIAGNOSIS — I10 Essential (primary) hypertension: Secondary | ICD-10-CM | POA: Diagnosis not present

## 2022-07-01 ENCOUNTER — Ambulatory Visit
Admission: RE | Admit: 2022-07-01 | Discharge: 2022-07-01 | Disposition: A | Discharge status: Home / Self Care | Payer: Medicare Other | Source: Ambulatory Visit | Attending: Family Medicine | Admitting: Family Medicine

## 2022-07-01 DIAGNOSIS — Z1231 Encounter for screening mammogram for malignant neoplasm of breast: Secondary | ICD-10-CM | POA: Diagnosis not present

## 2022-07-10 ENCOUNTER — Other Ambulatory Visit: Payer: Self-pay | Admitting: Orthopedic Surgery

## 2022-07-20 NOTE — Patient Instructions (Addendum)
SURGICAL WAITING ROOM VISITATION Patients having surgery or a procedure may have no more than 2 support people in the waiting area - these visitors may rotate.    Children under the age of 103 must have an adult with them who is not the patient.  If the patient needs to stay at the hospital during part of their recovery, the visitor guidelines for inpatient rooms apply. Pre-op nurse will coordinate an appropriate time for 1 support person to accompany patient in pre-op.  This support person may not rotate.    Please refer to the [facility] website for the visitor guidelines for Inpatients (after your surgery is over and you are in a regular room).     Your procedure is scheduled on: 08-06-22   Report to [facility] Main Entrance    Report to admitting at 7:00 AM   Call this number if you have problems the morning of surgery [phone]   Do not eat food :After Midnight.   After Midnight you may have the following liquids until 6:25 AM DAY OF SURGERY  Water Non-Citrus Juices (without pulp, NO RED-Apple, White grape, White cranberry) Black Coffee (NO MILK/CREAM OR CREAMERS, sugar ok)  Clear Tea (NO MILK/CREAM OR CREAMERS, sugar ok) regular and decaf                             Plain Jell-O (NO RED)                                           Fruit ices (not with fruit pulp, NO RED)                                     Popsicles (NO RED)                                                               Sports drinks like Gatorade (NO RED)                   The day of surgery:  Drink ONE (1) Pre-Surgery G2 at 6:25 AM the morning of surgery. Drink in one sitting. Do not sip.  This drink was given to you during your hospital  pre-op appointment visit. Nothing else to drink after completing the Pre-Surgery G2.          If you have questions, please contact your surgeon's office.   FOLLOW  ANY ADDITIONAL PRE OP INSTRUCTIONS YOU RECEIVED FROM YOUR SURGEON'S OFFICE!!!     Oral  Hygiene is also important to reduce your risk of infection.                                    Remember - BRUSH YOUR TEETH THE MORNING OF SURGERY WITH YOUR REGULAR TOOTHPASTE   Do NOT smoke after Midnight   Take these medicines the morning of surgery with A SIP OF WATER:   Pepcid  Gabapentin  Propranolol  Hydrocodone if needed  Tylenol if needed  Bring CPAP mask and tubing day of surgery.                              You may not have any metal on your body including hair pins, jewelry, and body piercing             Do not wear make-up, lotions, powders, perfumes or deodorant  Do not wear nail polish including gel and S&S, artificial/acrylic nails, or any other type of covering on natural nails including finger and toenails. If you have artificial nails, gel coating, etc. that needs to be removed by a nail salon please have this removed prior to surgery or surgery may need to be canceled/ delayed if the surgeon/ anesthesia feels like they are unable to be safely monitored.   Do not shave  5 days before surgery.                Do not bring valuables to the hospital. [facility] IS NOT RESPONSIBLE   FOR VALUABLES.   Contacts, dentures or bridgework may not be worn into surgery.  DO NOT BRING YOUR HOME MEDICATIONS TO THE HOSPITAL. PHARMACY WILL DISPENSE MEDICATIONS LISTED ON YOUR MEDICATION LIST TO YOU DURING YOUR ADMISSION IN THE HOSPITAL!    Patients discharged on the day of surgery will not be allowed to drive home.  Someone NEEDS to stay with you for the first 24 hours after anesthesia.   Special Instructions: Bring a copy of your healthcare power of attorney and living will documents the day of surgery if you haven't scanned them before.              Please read over the following fact sheets you were given: IF YOU HAVE QUESTIONS ABOUT YOUR PRE-OP INSTRUCTIONS PLEASE CALL [phone] [name]  If you received a COVID test during your pre-op visit  it is requested that you wear a mask  when out in public, stay away from anyone that may not be feeling well and notify your surgeon if you develop symptoms. If you test positive for Covid or have been in contact with anyone that has tested positive in the last 10 days please notify you surgeon.   [place]- Preparing for Total Shoulder Arthroplasty    Before surgery, you can play an important role. Because skin is not sterile, your skin needs to be as free of germs as possible. You can reduce the number of germs on your skin by using the following products. Benzoyl Peroxide Gel Reduces the number of germs present on the skin Applied twice a day to shoulder area starting two days before surgery    ==================================================================  Please follow these instructions carefully:  BENZOYL PEROXIDE 5% GEL  Please do not use if you have an allergy to benzoyl peroxide.   If your skin becomes reddened/irritated stop using the benzoyl peroxide.  Starting two days before surgery, apply as follows: Apply benzoyl peroxide in the morning and at night. Apply after taking a shower. If you are not taking a shower clean entire shoulder front, back, and side along with the armpit with a clean wet washcloth.  Place a quarter-sized dollop on your shoulder and rub in thoroughly, making sure to cover the front, back, and side of your shoulder, along with the armpit.   2 days before ____ AM   ____ PM  1 day before ____ AM   ____ PM                         Do this twice a day for two days.  (Last application is the night before surgery, AFTER using the CHG soap as described below).  Do NOT apply benzoyl peroxide gel on the day of surgery.    Pre-operative 5 CHG Bath Instructions   You can play a key role in reducing the risk of infection after surgery. Your skin needs to be as free of germs as possible. You can reduce the number of germs on your skin by washing with CHG (chlorhexidine gluconate)  soap before surgery. CHG is an antiseptic soap that kills germs and continues to kill germs even after washing.   DO NOT use if you have an allergy to chlorhexidine/CHG or antibacterial soaps. If your skin becomes reddened or irritated, stop using the CHG and notify one of our RNs at  [phone] .   Please shower with the CHG soap starting 4 days before surgery using the following schedule:     Please keep in mind the following:  DO NOT shave, including legs and underarms, starting the day of your first shower.   You may shave your face at any point before/day of surgery.  Place clean sheets on your bed the day you start using CHG soap. Use a clean washcloth (not used since being washed) for each shower. DO NOT sleep with pets once you start using the CHG.   CHG Shower Instructions:  If you choose to wash your hair and private area, wash first with your normal shampoo/soap.  After you use shampoo/soap, rinse your hair and body thoroughly to remove shampoo/soap residue.  Turn the water OFF and apply about 3 tablespoons (45 ml) of CHG soap to a CLEAN washcloth.  Apply CHG soap ONLY FROM YOUR NECK DOWN TO YOUR TOES (washing for 3-5 minutes)  DO NOT use CHG soap on face, private areas, open wounds, or sores.  Pay special attention to the area where your surgery is being performed.  If you are having back surgery, having someone wash your back for you may be helpful. Wait 2 minutes after CHG soap is applied, then you may rinse off the CHG soap.  Pat dry with a clean towel  Put on clean clothes/pajamas   If you choose to wear lotion, please use ONLY the CHG-compatible lotions on the back of this paper.     Additional instructions for the day of surgery: DO NOT APPLY any lotions, deodorants, cologne, or perfumes.   Put on clean/comfortable clothes.  Brush your teeth.  Ask your nurse before applying any prescription medications to the skin.      CHG Compatible Lotions   Aveeno  Moisturizing lotion  Cetaphil Moisturizing Cream  Cetaphil Moisturizing Lotion  Clairol Herbal Essence Moisturizing Lotion, Dry Skin  Clairol Herbal Essence Moisturizing Lotion, Extra Dry Skin  Clairol Herbal Essence Moisturizing Lotion, Normal Skin  Curel Age Defying Therapeutic Moisturizing Lotion with Alpha Hydroxy  Curel Extreme Care Body Lotion  Curel Soothing Hands Moisturizing Hand Lotion  Curel Therapeutic Moisturizing Cream, Fragrance-Free  Curel Therapeutic Moisturizing Lotion, Fragrance-Free  Curel Therapeutic Moisturizing Lotion, Original Formula  Eucerin Daily Replenishing Lotion  Eucerin Dry Skin Therapy Plus Alpha Hydroxy Crme  Eucerin Dry Skin Therapy Plus Alpha Hydroxy Lotion  Eucerin Original Crme  Eucerin Original Lotion  Eucerin Plus Crme Eucerin Plus  Lotion  Eucerin TriLipid Replenishing Lotion  Keri Anti-Bacterial Hand Lotion  Keri Deep Conditioning Original Lotion Dry Skin Formula Softly Scented  Keri Deep Conditioning Original Lotion, Fragrance Free Sensitive Skin Formula  Keri Lotion Fast Absorbing Fragrance Free Sensitive Skin Formula  Keri Lotion Fast Absorbing Softly Scented Dry Skin Formula  Keri Original Lotion  Keri Skin Renewal Lotion Keri Silky Smooth Lotion  Keri Silky Smooth Sensitive Skin Lotion  Nivea Body Creamy Conditioning Oil  Nivea Body Extra Enriched Lotion  Nivea Body Original Lotion  Nivea Body Sheer Moisturizing Lotion Nivea Crme  Nivea Skin Firming Lotion  NutraDerm 30 Skin Lotion  NutraDerm Skin Lotion  NutraDerm Therapeutic Skin Cream  NutraDerm Therapeutic Skin Lotion  ProShield Protective Hand Cream  Provon moisturizing lotion   PATIENT SIGNATURE_________________________________  NURSE SIGNATURE__________________________________  ________________________________________________________________________    Alicia Klein  An incentive spirometer is a tool that can help keep your lungs clear and active. This  tool measures how well you are filling your lungs with each breath. Taking long deep breaths may help reverse or decrease the chance of developing breathing (pulmonary) problems (especially infection) following: A long period of time when you are unable to move or be active. BEFORE THE PROCEDURE  If the spirometer includes an indicator to show your best effort, your nurse or respiratory therapist will set it to a desired goal. If possible, sit up straight or lean slightly forward. Try not to slouch. Hold the incentive spirometer in an upright position. INSTRUCTIONS FOR USE  Sit on the edge of your bed if possible, or sit up as far as you can in bed or on a chair. Hold the incentive spirometer in an upright position. Breathe out normally. Place the mouthpiece in your mouth and seal your lips tightly around it. Breathe in slowly and as deeply as possible, raising the piston or the ball toward the top of the column. Hold your breath for 3-5 seconds or for as long as possible. Allow the piston or ball to fall to the bottom of the column. Remove the mouthpiece from your mouth and breathe out normally. Rest for a few seconds and repeat Steps 1 through 7 at least 10 times every 1-2 hours when you are awake. Take your time and take a few normal breaths between deep breaths. The spirometer may include an indicator to show your best effort. Use the indicator as a goal to work toward during each repetition. After each set of 10 deep breaths, practice coughing to be sure your lungs are clear. If you have an incision (the cut made at the time of surgery), support your incision when coughing by placing a pillow or rolled up towels firmly against it. Once you are able to get out of bed, walk around indoors and cough well. You may stop using the incentive spirometer when instructed by your caregiver.  RISKS AND COMPLICATIONS Take your time so you do not get dizzy or light-headed. If you are in pain, you may need  to take or ask for pain medication before doing incentive spirometry. It is harder to take a deep breath if you are having pain. AFTER USE Rest and breathe slowly and easily. It can be helpful to keep track of a log of your progress. Your caregiver can provide you with a simple table to help with this. If you are using the spirometer at home, follow these instructions: SEEK MEDICAL CARE IF:  You are having difficultly using the spirometer. You have trouble using  the spirometer as often as instructed. Your pain medication is not giving enough relief while using the spirometer. You develop fever of 100.5 F (38.1 C) or higher. SEEK IMMEDIATE MEDICAL CARE IF:  You cough up bloody sputum that had not been present before. You develop fever of 102 F (38.9 C) or greater. You develop worsening pain at or near the incision site. MAKE SURE YOU:  Understand these instructions. Will watch your condition. Will get help right away if you are not doing well or get worse. Document Released: 05/11/2006 Document Revised: 03/23/2011 Document Reviewed: 07/12/2006 [facility] Patient Information 2014 [place], [place].   ________________________________________________________________________

## 2022-07-20 NOTE — Progress Notes (Addendum)
COVID Vaccine Completed:  Yes  Date of COVID positive in last 90 days:  No  PCP - Johny Blamer, MD Cardiologist - N/A Neurologist - Lurena Joiner Tat, DO  Chest x-ray - 07-24-22 Epic EKG - 07-24-22 Epic Stress Test - Yes, several years ago ECHO -  N/A Cardiac Cath -  N/A Pacemaker/ICD device last checked: Spinal Cord Stimulator: N/A  Bowel Prep -  N/A  Sleep Study - Yes, +sleep apnea CPAP - Has not used in the past couple of momths  Prediabetes Fasting Blood Sugar -  Checks Blood Sugar - does not check   Last dose of GLP1 agonist-  N/A GLP1 instructions:  N/A   Last dose of SGLT-2 inhibitors-  N/A SGLT-2 instructions: N/A  Blood Thinner Instructions:  Time Aspirin Instructions:  ASA 81.  Per patient to stop a week prior to surgery  Last Dose:  Activity level:  Difficulty climbing stairs due to fibromyalgia and joint pain.  Lives alone and is able to perform activities of daily living without stopping and without symptoms of chest pain or shortness of breath.  Anesthesia review:  Essential tremor, tremors noted in R hand and face during PAT appointment.  Last saw neurology in 2020.  States that tremor can worsen with stress.  Also has fibromyalgia, OSA and HTN  Patient denies shortness of breath, fever, cough and chest pain at PAT appointment  Patient verbalized understanding of instructions that were given to them at the PAT appointment. Patient was also instructed that they will need to review over the PAT instructions again at home before surgery.

## 2022-07-24 ENCOUNTER — Encounter (HOSPITAL_COMMUNITY): Payer: Self-pay

## 2022-07-24 ENCOUNTER — Ambulatory Visit (HOSPITAL_COMMUNITY)
Admission: RE | Admit: 2022-07-24 | Discharge: 2022-07-24 | Disposition: A | Discharge status: Home / Self Care | Payer: Medicare Other | Source: Ambulatory Visit | Attending: Orthopedic Surgery | Admitting: Orthopedic Surgery

## 2022-07-24 ENCOUNTER — Encounter (HOSPITAL_COMMUNITY)
Admission: RE | Admit: 2022-07-24 | Discharge: 2022-07-24 | Disposition: A | Discharge status: Home / Self Care | Payer: Medicare Other | Source: Ambulatory Visit | Attending: Orthopedic Surgery | Admitting: Orthopedic Surgery

## 2022-07-24 ENCOUNTER — Other Ambulatory Visit: Payer: Self-pay

## 2022-07-24 VITALS — BP 136/79 | HR 63 | Temp 98.5°F | Resp 16 | Ht 64.0 in | Wt 265.8 lb

## 2022-07-24 DIAGNOSIS — G4733 Obstructive sleep apnea (adult) (pediatric): Secondary | ICD-10-CM | POA: Diagnosis not present

## 2022-07-24 DIAGNOSIS — Z01818 Encounter for other preprocedural examination: Secondary | ICD-10-CM | POA: Insufficient documentation

## 2022-07-24 DIAGNOSIS — R7303 Prediabetes: Secondary | ICD-10-CM | POA: Diagnosis not present

## 2022-07-24 DIAGNOSIS — I10 Essential (primary) hypertension: Secondary | ICD-10-CM | POA: Insufficient documentation

## 2022-07-24 DIAGNOSIS — I700 Atherosclerosis of aorta: Secondary | ICD-10-CM | POA: Diagnosis not present

## 2022-07-24 DIAGNOSIS — K219 Gastro-esophageal reflux disease without esophagitis: Secondary | ICD-10-CM | POA: Insufficient documentation

## 2022-07-24 HISTORY — DX: Anxiety disorder, unspecified: F41.9

## 2022-07-24 HISTORY — DX: Essential tremor: G25.0

## 2022-07-24 HISTORY — DX: Anemia, unspecified: D64.9

## 2022-07-24 LAB — BASIC METABOLIC PANEL
Anion gap: 7 (ref 5–15)
BUN: 15 mg/dL (ref 8–23)
CO2: 23 mmol/L (ref 22–32)
Calcium: 9.2 mg/dL (ref 8.9–10.3)
Chloride: 107 mmol/L (ref 98–111)
Creatinine, Ser: 0.8 mg/dL (ref 0.44–1.00)
GFR, Estimated: >60 mL/min (ref >60)
Glucose, Bld: 139 mg/dL — ABNORMAL HIGH (ref 70–99)
Potassium: 3.6 mmol/L (ref 3.5–5.1)
Sodium: 137 mmol/L (ref 135–145)

## 2022-07-24 LAB — CBC
HCT: 44.9 % (ref 36.0–46.0)
Hemoglobin: 14.6 g/dL (ref 12.0–15.0)
MCH: 30.5 pg (ref 26.0–34.0)
MCHC: 32.5 g/dL (ref 30.0–36.0)
MCV: 93.7 fL (ref 80.0–100.0)
Platelets: 258 10*3/uL (ref 150–400)
RBC: 4.79 MIL/uL (ref 3.87–5.11)
RDW: 12.5 % (ref 11.5–15.5)
WBC: 6.7 10*3/uL (ref 4.0–10.5)
nRBC: 0 % (ref 0.0–0.2)

## 2022-07-24 LAB — SURGICAL PCR SCREEN
MRSA, PCR: NEGATIVE
Staphylococcus aureus: NEGATIVE

## 2022-07-27 NOTE — Progress Notes (Signed)
Choose an anesthesia record to view details        DISCUSSION: Alicia Klein is a 67 yo female who presents to PAT prior to right reverse shoulder arthroplasty with Dr. Ave Filter on 08/06/22. PMH significant for HTN, OSA (does not use CPAP), GERD, essential tremor, chronic pain, prediabetes.  No prior anesthesia complications  Pt with hx of essential tremor. Last saw Neurology in 2020 however per PCP tremor has been worsening and she has been referred to Neurology again to evaluate for possible Parkinsons disease. Followed by PCP for other chronic medical issues. Last seen 07/01/22. All issues appear stable.  VS: BP 136/79   Pulse 63   Temp 36.9 C (Oral)   Resp 16   Ht 5\' 4"  (1.626 m)   Wt 120.6 kg   SpO2 97%   BMI 45.62 kg/m   PROVIDERS: Noberto Retort, MD   LABS: Labs reviewed: Acceptable for surgery. (all labs ordered are listed, but only abnormal results are displayed)  Labs Reviewed  BASIC METABOLIC PANEL - Abnormal; Notable for the following components:      Result Value   Glucose, Bld 139 (*)    All other components within normal limits  SURGICAL PCR SCREEN  CBC     IMAGES:  IMPRESSION: 1. Prior rotator cuff repair with a single anchor tendinosis backed out along the anterolateral humerus. Complete tear of the supraspinatus tendon with 4.5 cm of retraction. 2. Large full-thickness, near complete, tear of the infraspinatus tendon. 3. Mild tendinosis of the subscapularis tendon. 4. Moderate osteoarthritis of the glenohumeral joint.  EKG 07/24/22:  NSR, rate 70   CV: n/a   Past Medical History:  Diagnosis Date   Anemia    During pregnancy   Anxiety    Arthritis    Depression    Essential tremor    Fibromyalgia    GERD (gastroesophageal reflux disease)    Headache    hx of migraines    Hypertension    Pre-diabetes    PVC (premature ventricular contraction)    Sleep apnea    cpap - does not know settings   Vertigo     Past Surgical  History:  Procedure Laterality Date   CARPAL TUNNEL RELEASE     both wrists   COLONOSCOPY WITH PROPOFOL N/A 12/04/2015   Procedure: COLONOSCOPY WITH PROPOFOL;  Surgeon: Graylin Shiver, MD;  Location: [facility] ENDOSCOPY;  Service: Endoscopy;  Laterality: N/A;   ELBOW SURGERY Left    KNEE SURGERY     right knee   right ankle surgery      ROTATOR CUFF REPAIR Bilateral     MEDICATIONS:  Acetaminophen (TYLENOL 8 HOUR ARTHRITIS PAIN PO)   aspirin EC 81 MG tablet   baclofen (LIORESAL) 10 MG tablet   BIOTIN PO   cholecalciferol (VITAMIN D) 1000 UNITS tablet   citalopram (CELEXA) 40 MG tablet   cyanocobalamin (VITAMIN B12) 1000 MCG tablet   famotidine (PEPCID) 20 MG tablet   furosemide (LASIX) 80 MG tablet   gabapentin (NEURONTIN) 600 MG tablet   HYDROcodone-acetaminophen (NORCO/VICODIN) 5-325 MG per tablet   KLOR-CON M10 10 MEQ tablet   Multiple Vitamins-Minerals (MULTIVITAMINS THER. W/MINERALS) TABS   Omega-3 Fatty Acids (OMEGA 3 PO)   Polyvinyl Alcohol (LUBRICANT DROPS OP)   propranolol (INDERAL) 40 MG tablet   rosuvastatin (CRESTOR) 5 MG tablet   topiramate (TOPAMAX) 50 MG tablet   No current facility-administered medications for this encounter.   Ubaldo Glassing, PA-C [facility] Short  Stay/Anesthesiology [facility] Phone [phone] 07/27/2022 3:33 PM

## 2022-07-27 NOTE — Anesthesia Preprocedure Evaluation (Addendum)
Anesthesia Evaluation  Patient identified by MRN, date of birth, ID band Patient awake    Reviewed: Allergy & Precautions, NPO status , Patient's Chart, lab work & pertinent test results, reviewed documented beta blocker date and time   History of Anesthesia Complications Negative for: history of anesthetic complications  Airway Mallampati: III  TM Distance: >3 FB Neck ROM: Full    Dental  (+) Dental Advisory Given   Pulmonary sleep apnea and Continuous Positive Airway Pressure Ventilation    Pulmonary exam normal        Cardiovascular hypertension, Pt. on medications and Pt. on home beta blockers Normal cardiovascular exam     Neuro/Psych  Headaches PSYCHIATRIC DISORDERS Anxiety Depression     Vertigo Baseline tremor, being worked up for [employer] Disease     GI/Hepatic Neg liver ROS,GERD  Medicated and Controlled,,  Endo/Other    Morbid obesity Pre-dm   Renal/GU negative Renal ROS     Musculoskeletal  (+) Arthritis ,  Fibromyalgia -  Abdominal  (+) + obese  Peds  Hematology negative hematology ROS (+)   Anesthesia Other Findings   Reproductive/Obstetrics                             Anesthesia Physical Anesthesia Plan  ASA: 3  Anesthesia Plan: General   Post-op Pain Management: Regional block* and Tylenol PO (pre-op)*   Induction: Intravenous  PONV Risk Score and Plan: 3 and Treatment may vary due to age or medical condition, Ondansetron and Dexamethasone  Airway Management Planned: Oral ETT and Video Laryngoscope Planned  Additional Equipment: None  Intra-op Plan:   Post-operative Plan: Extubation in OR  Informed Consent: I have reviewed the patients History and Physical, chart, labs and discussed the procedure including the risks, benefits and alternatives for the proposed anesthesia with the patient or authorized representative who has indicated his/her  understanding and acceptance.     Dental advisory given  Plan Discussed with: CRNA and Anesthesiologist  Anesthesia Plan Comments: (See PAT note Lengthy discussion had regarding nerve block. Patient stated she had extreme anxiety after a brachial plexus block for a left rotator cuff surgery because of her hand being numb for a few days. Explained that an interscalene block typically spares the hand, however, there is no guarantee that she will not experience temporary hand numbness from the block again. After much discussion, patient decided she does want the block, but requests no exparel to avoid prolonged numbness.)        Anesthesia Quick Evaluation

## 2022-07-28 NOTE — Care Plan (Signed)
Ortho Bundle Case Management Note  Patient Details  Name: Alicia Klein MRN: [id number] Date of Birth: [birth year]    Patient will discharge to home with family to assist. No DME or therapy needs at thsi time. Will remain in sling until follow up office visit.                  DME Arranged:    DME Agency:     HH Arranged:    HH Agency:     Additional Comments: Please contact me with any questions of if this plan should need to change.  Shauna Hugh,  RN,BSN,MHA,CCM  [facility]  [phone] 07/28/2022, 2:06 PM

## 2022-08-06 ENCOUNTER — Other Ambulatory Visit: Payer: Self-pay

## 2022-08-06 ENCOUNTER — Encounter (HOSPITAL_COMMUNITY)
Admission: RE | Disposition: A | Discharge status: Home / Self Care | Payer: Self-pay | Source: Ambulatory Visit | Attending: Orthopedic Surgery

## 2022-08-06 ENCOUNTER — Ambulatory Visit (HOSPITAL_BASED_OUTPATIENT_CLINIC_OR_DEPARTMENT_OTHER): Payer: Medicare Other | Admitting: Anesthesiology

## 2022-08-06 ENCOUNTER — Ambulatory Visit (HOSPITAL_COMMUNITY)
Admission: RE | Admit: 2022-08-06 | Discharge: 2022-08-06 | Disposition: A | Discharge status: Home / Self Care | Payer: Medicare Other | Source: Ambulatory Visit | Attending: Orthopedic Surgery | Admitting: Orthopedic Surgery

## 2022-08-06 ENCOUNTER — Ambulatory Visit (HOSPITAL_COMMUNITY): Payer: Medicare Other | Admitting: Medical

## 2022-08-06 ENCOUNTER — Encounter (HOSPITAL_COMMUNITY): Payer: Self-pay | Admitting: Orthopedic Surgery

## 2022-08-06 DIAGNOSIS — M797 Fibromyalgia: Secondary | ICD-10-CM | POA: Insufficient documentation

## 2022-08-06 DIAGNOSIS — G4730 Sleep apnea, unspecified: Secondary | ICD-10-CM | POA: Insufficient documentation

## 2022-08-06 DIAGNOSIS — Z79899 Other long term (current) drug therapy: Secondary | ICD-10-CM | POA: Diagnosis not present

## 2022-08-06 DIAGNOSIS — F419 Anxiety disorder, unspecified: Secondary | ICD-10-CM | POA: Insufficient documentation

## 2022-08-06 DIAGNOSIS — Z6841 Body Mass Index (BMI) 40.0 and over, adult: Secondary | ICD-10-CM | POA: Diagnosis not present

## 2022-08-06 DIAGNOSIS — Z96611 Presence of right artificial shoulder joint: Secondary | ICD-10-CM | POA: Diagnosis not present

## 2022-08-06 DIAGNOSIS — I10 Essential (primary) hypertension: Secondary | ICD-10-CM

## 2022-08-06 DIAGNOSIS — T84098A Other mechanical complication of other internal joint prosthesis, initial encounter: Secondary | ICD-10-CM | POA: Diagnosis not present

## 2022-08-06 DIAGNOSIS — M75121 Complete rotator cuff tear or rupture of right shoulder, not specified as traumatic: Secondary | ICD-10-CM | POA: Diagnosis not present

## 2022-08-06 DIAGNOSIS — F32A Depression, unspecified: Secondary | ICD-10-CM | POA: Diagnosis not present

## 2022-08-06 DIAGNOSIS — G250 Essential tremor: Secondary | ICD-10-CM | POA: Diagnosis not present

## 2022-08-06 DIAGNOSIS — M19011 Primary osteoarthritis, right shoulder: Secondary | ICD-10-CM | POA: Diagnosis not present

## 2022-08-06 DIAGNOSIS — D649 Anemia, unspecified: Secondary | ICD-10-CM | POA: Diagnosis not present

## 2022-08-06 DIAGNOSIS — K219 Gastro-esophageal reflux disease without esophagitis: Secondary | ICD-10-CM | POA: Diagnosis not present

## 2022-08-06 DIAGNOSIS — G8918 Other acute postprocedural pain: Secondary | ICD-10-CM | POA: Diagnosis not present

## 2022-08-06 DIAGNOSIS — M75101 Unspecified rotator cuff tear or rupture of right shoulder, not specified as traumatic: Secondary | ICD-10-CM | POA: Diagnosis not present

## 2022-08-06 HISTORY — PX: REVERSE SHOULDER ARTHROPLASTY: SHX5054

## 2022-08-06 LAB — AEROBIC/ANAEROBIC CULTURE W GRAM STAIN (SURGICAL/DEEP WOUND): Gram Stain: NONE SEEN

## 2022-08-06 SURGERY — ARTHROPLASTY, SHOULDER, TOTAL, REVERSE
Anesthesia: General | Site: Shoulder | Laterality: Right

## 2022-08-06 MED ORDER — CHLORHEXIDINE GLUCONATE 0.12 % MT SOLN
15.0000 mL | Freq: Once | OROMUCOSAL | Status: AC
  Administered 2022-08-06: 15 mL via OROMUCOSAL

## 2022-08-06 MED ORDER — ONDANSETRON HCL 4 MG/2ML IJ SOLN
INTRAMUSCULAR | Status: AC
  Filled 2022-08-06: qty 2

## 2022-08-06 MED ORDER — ORAL CARE MOUTH RINSE: 15.0000 mL | Freq: Once | OROMUCOSAL | Status: AC

## 2022-08-06 MED ORDER — TRANEXAMIC ACID-NACL 1000-0.7 MG/100ML-% IV SOLN
1000.0000 mg | INTRAVENOUS | Status: AC
  Administered 2022-08-06: 1000 mg via INTRAVENOUS
  Filled 2022-08-06: qty 100

## 2022-08-06 MED ORDER — ACETAMINOPHEN 10 MG/ML IV SOLN
1000.0000 mg | Freq: Once | INTRAVENOUS | Status: AC
  Administered 2022-08-06: 1000 mg via INTRAVENOUS

## 2022-08-06 MED ORDER — FENTANYL CITRATE PF 50 MCG/ML IJ SOSY
PREFILLED_SYRINGE | INTRAMUSCULAR | Status: AC
  Filled 2022-08-06: qty 1

## 2022-08-06 MED ORDER — ONDANSETRON HCL 4 MG/2ML IJ SOLN
4.0000 mg | Freq: Once | INTRAMUSCULAR | Status: AC | PRN
  Administered 2022-08-06: 4 mg via INTRAVENOUS

## 2022-08-06 MED ORDER — PHENYLEPHRINE 80 MCG/ML (10ML) SYRINGE FOR IV PUSH (FOR BLOOD PRESSURE SUPPORT)
PREFILLED_SYRINGE | INTRAVENOUS | Status: DC | PRN
  Administered 2022-08-06: 80 ug via INTRAVENOUS

## 2022-08-06 MED ORDER — ACETAMINOPHEN 10 MG/ML IV SOLN
INTRAVENOUS | Status: AC
  Filled 2022-08-06: qty 100

## 2022-08-06 MED ORDER — CEFAZOLIN IN SODIUM CHLORIDE 3-0.9 GM/100ML-% IV SOLN
3.0000 g | INTRAVENOUS | Status: AC
  Administered 2022-08-06: 2 g via INTRAVENOUS
  Filled 2022-08-06: qty 100

## 2022-08-06 MED ORDER — FENTANYL CITRATE PF 50 MCG/ML IJ SOSY
100.0000 ug | PREFILLED_SYRINGE | INTRAMUSCULAR | Status: DC
  Administered 2022-08-06 (×2): 50 ug via INTRAVENOUS
  Filled 2022-08-06: qty 2

## 2022-08-06 MED ORDER — PROPOFOL 10 MG/ML IV BOLUS
INTRAVENOUS | Status: DC | PRN
  Administered 2022-08-06: 130 mg via INTRAVENOUS

## 2022-08-06 MED ORDER — ONDANSETRON HCL 4 MG PO TABS: 4.0000 mg | ORAL_TABLET | Freq: Three times a day (TID) | ORAL | 0 refills | Status: DC | PRN

## 2022-08-06 MED ORDER — LIDOCAINE 2% (20 MG/ML) 5 ML SYRINGE
INTRAMUSCULAR | Status: DC | PRN
  Administered 2022-08-06: 30 mg via INTRAVENOUS

## 2022-08-06 MED ORDER — SUCCINYLCHOLINE CHLORIDE 200 MG/10ML IV SOSY
PREFILLED_SYRINGE | INTRAVENOUS | Status: DC | PRN
  Administered 2022-08-06: 10 mg via INTRAVENOUS

## 2022-08-06 MED ORDER — FENTANYL CITRATE PF 50 MCG/ML IJ SOSY
25.0000 ug | PREFILLED_SYRINGE | INTRAMUSCULAR | Status: DC | PRN
  Administered 2022-08-06: 25 ug via INTRAVENOUS

## 2022-08-06 MED ORDER — MIDAZOLAM HCL 2 MG/2ML IJ SOLN
2.0000 mg | INTRAMUSCULAR | Status: DC
  Administered 2022-08-06: 1 mg via INTRAVENOUS
  Filled 2022-08-06: qty 2

## 2022-08-06 MED ORDER — HYDROCODONE-ACETAMINOPHEN 5-325 MG PO TABS: 1.0000 | ORAL_TABLET | Freq: Four times a day (QID) | ORAL | 0 refills | Status: AC | PRN

## 2022-08-06 MED ORDER — BUPIVACAINE-EPINEPHRINE (PF) 0.5% -1:200000 IJ SOLN
INTRAMUSCULAR | Status: DC | PRN
  Administered 2022-08-06: 30 mL via PERINEURAL

## 2022-08-06 MED ORDER — ACETAMINOPHEN 500 MG PO TABS: 1000.0000 mg | ORAL_TABLET | Freq: Once | ORAL | Status: DC

## 2022-08-06 MED ORDER — ROCURONIUM BROMIDE 100 MG/10ML IV SOLN
INTRAVENOUS | Status: DC | PRN
  Administered 2022-08-06: 30 mg via INTRAVENOUS

## 2022-08-06 MED ORDER — EPHEDRINE SULFATE (PRESSORS) 50 MG/ML IJ SOLN
INTRAMUSCULAR | Status: DC | PRN
  Administered 2022-08-06 (×3): 5 mg via INTRAVENOUS
  Administered 2022-08-06 (×2): 10 mg via INTRAVENOUS

## 2022-08-06 MED ORDER — SODIUM CHLORIDE 0.9 % IR SOLN
Status: DC | PRN
  Administered 2022-08-06: 1000 mL

## 2022-08-06 MED ORDER — 0.9 % SODIUM CHLORIDE (POUR BTL) OPTIME
TOPICAL | Status: DC | PRN
  Administered 2022-08-06: 1000 mL

## 2022-08-06 MED ORDER — ONDANSETRON HCL 4 MG/2ML IJ SOLN
INTRAMUSCULAR | Status: DC | PRN
  Administered 2022-08-06: 4 mg via INTRAVENOUS

## 2022-08-06 MED ORDER — WATER FOR IRRIGATION, STERILE IR SOLN
Status: DC | PRN
  Administered 2022-08-06: 2000 mL

## 2022-08-06 MED ORDER — LACTATED RINGERS IV SOLN: INTRAVENOUS | Status: DC

## 2022-08-06 MED ORDER — FENTANYL CITRATE (PF) 100 MCG/2ML IJ SOLN
INTRAMUSCULAR | Status: AC
  Filled 2022-08-06: qty 2

## 2022-08-06 MED ORDER — SUGAMMADEX SODIUM 200 MG/2ML IV SOLN
INTRAVENOUS | Status: DC | PRN
  Administered 2022-08-06: 200 mg via INTRAVENOUS

## 2022-08-06 MED ORDER — FENTANYL CITRATE (PF) 100 MCG/2ML IJ SOLN
INTRAMUSCULAR | Status: DC | PRN
  Administered 2022-08-06: 50 ug via INTRAVENOUS

## 2022-08-06 MED ORDER — METHOCARBAMOL 500 MG PO TABS: 500.0000 mg | ORAL_TABLET | Freq: Four times a day (QID) | ORAL | 0 refills | Status: DC | PRN

## 2022-08-06 MED ORDER — METHOCARBAMOL 500 MG IVPB - SIMPLE MED: 500.0000 mg | Freq: Four times a day (QID) | INTRAVENOUS | Status: DC | PRN

## 2022-08-06 MED ORDER — METHOCARBAMOL 500 MG PO TABS: 500.0000 mg | ORAL_TABLET | Freq: Four times a day (QID) | ORAL | Status: DC | PRN

## 2022-08-06 MED ORDER — DEXAMETHASONE SODIUM PHOSPHATE 10 MG/ML IJ SOLN
INTRAMUSCULAR | Status: DC | PRN
  Administered 2022-08-06: 10 mg via INTRAVENOUS

## 2022-08-06 SURGICAL SUPPLY — 78 items
AID PSTN UNV HD RSTRNT DISP (MISCELLANEOUS) ×1
BAG COUNTER SPONGE SURGICOUNT (BAG) IMPLANT
BAG SPEC THK2 15X12 ZIP CLS (MISCELLANEOUS) ×1
BAG SPNG CNTER NS LX DISP (BAG)
BAG ZIPLOCK 12X15 (MISCELLANEOUS) ×1 IMPLANT
BASEPLATE P2 COATD GLND 6.5X30 (Shoulder) IMPLANT
BIT DRILL 1.6MX128 (BIT) IMPLANT
BIT DRILL 2.5 DIA 127 CALI (BIT) IMPLANT
BIT DRILL 4 DIA CALIBRATED (BIT) IMPLANT
BLADE SAW SGTL 73X25 THK (BLADE) ×1 IMPLANT
BOOTIES KNEE HIGH SLOAN (MISCELLANEOUS) ×2 IMPLANT
BSPLAT GLND 30 STRL LF SHLDR (Shoulder) ×1 IMPLANT
CLSR STERI-STRIP ANTIMIC 1/2X4 (GAUZE/BANDAGES/DRESSINGS) IMPLANT
COOLER ICEMAN CLASSIC (MISCELLANEOUS) IMPLANT
COVER BACK TABLE 60X90IN (DRAPES) ×1 IMPLANT
COVER SURGICAL LIGHT HANDLE (MISCELLANEOUS) ×1 IMPLANT
DRAPE INCISE IOBAN 66X45 STRL (DRAPES) ×1 IMPLANT
DRAPE ORTHO SPLIT 77X108 STRL (DRAPES) ×2
DRAPE POUCH INSTRU U-SHP 10X18 (DRAPES) ×1 IMPLANT
DRAPE SHEET LG 3/4 BI-LAMINATE (DRAPES) ×1 IMPLANT
DRAPE SURG 17X11 SM STRL (DRAPES) ×1 IMPLANT
DRAPE SURG ORHT 6 SPLT 77X108 (DRAPES) ×2 IMPLANT
DRAPE TOP 10253 STERILE (DRAPES) ×1 IMPLANT
DRAPE U-SHAPE 47X51 STRL (DRAPES) ×1 IMPLANT
DRSG AQUACEL AG ADV 3.5X 6 (GAUZE/BANDAGES/DRESSINGS) ×1 IMPLANT
DURAPREP 26ML APPLICATOR (WOUND CARE) ×2 IMPLANT
ELECT BLADE TIP CTD 4 INCH (ELECTRODE) ×1 IMPLANT
ELECT REM PT RETURN 15FT ADLT (MISCELLANEOUS) ×1 IMPLANT
FACESHIELD WRAPAROUND (MASK) ×1 IMPLANT
FACESHIELD WRAPAROUND OR TEAM (MASK) ×1 IMPLANT
GLOVE BIO SURGEON STRL SZ7.5 (GLOVE) ×1 IMPLANT
GLOVE BIOGEL PI IND STRL 6.5 (GLOVE) ×1 IMPLANT
GLOVE BIOGEL PI IND STRL 8 (GLOVE) ×1 IMPLANT
GLOVE SURG SS PI 6.5 STRL IVOR (GLOVE) ×1 IMPLANT
GOWN STRL REUS W/ TWL LRG LVL3 (GOWN DISPOSABLE) ×1 IMPLANT
GOWN STRL REUS W/ TWL XL LVL3 (GOWN DISPOSABLE) ×1 IMPLANT
GOWN STRL REUS W/TWL LRG LVL3 (GOWN DISPOSABLE) ×1
GOWN STRL REUS W/TWL XL LVL3 (GOWN DISPOSABLE) ×1
HANDPIECE INTERPULSE COAX TIP (DISPOSABLE) ×1
HOOD PEEL AWAY T7 (MISCELLANEOUS) ×3 IMPLANT
INSERT EPOLY STND HUMERUS 32MM (Shoulder) ×1 IMPLANT
INSERT EPOLYSTD HUMERUS 32MM (Shoulder) IMPLANT
KIT BASIN OR (CUSTOM PROCEDURE TRAY) ×1 IMPLANT
KIT TURNOVER KIT A (KITS) IMPLANT
MANIFOLD NEPTUNE II (INSTRUMENTS) ×1 IMPLANT
NDL TROCAR POINT SZ 2 1/2 (NEEDLE) IMPLANT
NEEDLE TROCAR POINT SZ 2 1/2 (NEEDLE) IMPLANT
NS IRRIG 1000ML POUR BTL (IV SOLUTION) ×1 IMPLANT
P2 COATDE GLNOID BSEPLT 6.5X30 (Shoulder) ×1 IMPLANT
PACK SHOULDER (CUSTOM PROCEDURE TRAY) ×1 IMPLANT
PAD COLD SHLDR WRAP-ON (PAD) IMPLANT
PROTECTOR NERVE ULNAR (MISCELLANEOUS) IMPLANT
RESTRAINT HEAD UNIVERSAL NS (MISCELLANEOUS) ×1 IMPLANT
RETRIEVER SUT HEWSON (MISCELLANEOUS) IMPLANT
SCREW BONE LOCKING RSP 5.0X14 (Screw) ×2 IMPLANT
SCREW BONE LOCKING RSP 5.0X30 (Screw) ×2 IMPLANT
SCREW BONE RSP LOCK 5X14 (Screw) IMPLANT
SCREW BONE RSP LOCK 5X30 (Screw) IMPLANT
SCREW RETAIN W/HEAD 4MM OFFSET (Shoulder) IMPLANT
SET HNDPC FAN SPRY TIP SCT (DISPOSABLE) ×1 IMPLANT
SLING ARM FOAM STRAP LRG (SOFTGOODS) IMPLANT
SLING ARM IMMOBILIZER LRG (SOFTGOODS) IMPLANT
SLING ARM IMMOBILIZER MED (SOFTGOODS) IMPLANT
STEM HUMERAL 12X48 STD SHORT (Shoulder) IMPLANT
STRIP CLOSURE SKIN 1/2X4 (GAUZE/BANDAGES/DRESSINGS) ×2 IMPLANT
SUCTION TUBE FRAZIER 10FR DISP (SUCTIONS) IMPLANT
SUPPORT WRAP ARM LG (MISCELLANEOUS) ×1 IMPLANT
SUT ETHIBOND 2 V 37 (SUTURE) IMPLANT
SUT FIBERWIRE #2 38 REV NDL BL (SUTURE)
SUT MNCRL AB 4-0 PS2 18 (SUTURE) ×1 IMPLANT
SUT VIC AB 2-0 CT1 27 (SUTURE) ×2
SUT VIC AB 2-0 CT1 TAPERPNT 27 (SUTURE) ×2 IMPLANT
SUTURE FIBERWR#2 38 REV NDL BL (SUTURE) IMPLANT
TAPE LABRALWHITE 1.5X36 (TAPE) IMPLANT
TAPE SUT LABRALTAP WHT/BLK (SUTURE) IMPLANT
TOWEL OR 17X26 10 PK STRL BLUE (TOWEL DISPOSABLE) ×1 IMPLANT
TOWEL OR NON WOVEN STRL DISP B (DISPOSABLE) ×1 IMPLANT
WATER STERILE IRR 1000ML POUR (IV SOLUTION) ×1 IMPLANT

## 2022-08-06 NOTE — Discharge Instructions (Addendum)
Discharge Instructions after Reverse Total Shoulder Arthroplasty   A sling has been provided for you. You are to wear this at all times (except for bathing and dressing), until your first post operative visit with Dr. Ave Filter. Please also wear while sleeping at night. While you bath and dress, let the arm/elbow extend straight down to stretch your elbow. Wiggle your fingers and pump your first while your in the sling to prevent hand swelling. Use ice on the shoulder intermittently over the first 48 hours after surgery. Continue to use ice or and ice machine as needed after 48 hours for pain control/swelling.  Pain medicine has been prescribed for you.  Use your medicine liberally over the first 48 hours, and then you can begin to taper your use. You may take Extra Strength Tylenol or Tylenol only in place of the pain pills. DO NOT take ANY nonsteroidal anti-inflammatory pain medications: Advil, Motrin, Ibuprofen, Aleve, Naproxen or Naprosyn.  Resume aspirin the day after surgery No driving until seen back in the office Leave your dressing on until your first follow up visit.  You may shower with the dressing.  Hold your arm as if you still have your sling on while you shower. Simply allow the water to wash over the site and then pat dry. Make sure your axilla (armpit) is completely dry after showering.    Please call [phone] during normal business hours or [phone] after hours for any problems. Including the following:  - excessive redness of the incisions - drainage for more than 4 days - fever of more than 101.5 F  *Please note that pain medications will not be refilled after hours or on weekends.    Dental Antibiotics:  In most cases prophylactic antibiotics for Dental procdeures after total joint surgery are not necessary.  Exceptions are as follows:  1. History of prior total joint infection  2. Severely immunocompromised (Organ Transplant, cancer chemotherapy,  Rheumatoid biologic meds such as Humera)  3. Poorly controlled diabetes (A1C &gt; 8.0, blood glucose over 200)  If you have one of these conditions, contact your surgeon for an antibiotic prescription, prior to your dental procedure.

## 2022-08-06 NOTE — Op Note (Signed)
Procedure(s): REVERSE SHOULDER ARTHROPLASTY Procedure Note  Alicia Klein female 67 y.o. 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 female  With right shoulder failed rotator cuff repair. pain and dysfunction interfered with quality of life and nonoperative treatment with activity modification, NSAIDS and injections failed.     Surgeon: Glennon Hamilton   Assistants: Fredia Sorrow PA-C Amber was present and scrubbed throughout the procedure and was essential in positioning, retraction, exposure, and closure)  Anesthesia: General endotracheal anesthesia with preoperative interscalene block given by the attending anesthesiologist   Procedure Detail  REVERSE SHOULDER ARTHROPLASTY   Estimated Blood Loss:  200 mL         Drains: none  Blood Given: none          Specimens: none        Complications:  * No complications entered in OR log *         Disposition: PACU - hemodynamically stable.         Condition: stable      OPERATIVE FINDINGS:  A DJO Altivate pressfit reverse total shoulder arthroplasty was placed with a  size 10 stem, a 32-415 glenosphere, and a standard-mm poly insert. The base plate  fixation was good.  PROCEDURE: The patient was identified in the preoperative holding area  where I personally marked the operative site after verifying site, side,  and procedure with the patient. An interscalene block given by  the attending anesthesiologist in the holding area and the patient was taken back to the operating room where all extremities were  carefully padded in position after general anesthesia was induced. She  was placed in a beach-chair position and the operative upper extremity was  prepped and draped in a standard sterile fashion. An  approximately 10-  cm incision was made from the tip of the coracoid process to the center  point of the humerus at the level of the axilla. Dissection was carried  down through subcutaneous tissues to the level of the cephalic vein  which was taken laterally with the deltoid. The pectoralis major was  retracted medially. The subdeltoid space was developed and the lateral  edge of the conjoined tendon was identified. The undersurface of  conjoined tendon was palpated and the musculocutaneous nerve was not in  the field. Retractor was placed underneath the conjoined and second  retractor was placed lateral into the deltoid. The circumflex humeral  artery and vessels were identified and clamped and coagulated. The  biceps tendon was absent.  The subscapularis was taken down as a peel with the underlying capsule.  The  joint was then gently externally rotated while the capsule was released  from the humeral neck around to just beyond the 6 o'clock position. At  this point, the joint was dislocated and the humeral head was presented  into the wound.  Cultures were taken from the glenohumeral joint.  The excessive osteophyte formation was removed with a  large rongeur.  The previous sutures and anchors were removed.  The cutting guide was used to make the appropriate  head cut and the head was saved for potentially bone grafting.  The glenoid was exposed with the arm in an  abducted extended position. The anterior and posterior labrum were  completely excised and the capsule was released circumferentially to  allow  for exposure of the glenoid for preparation. The 2.5 mm drill was  placed using the guide in 5-10 inferior angulation and the tap was then advanced in the same hole. Small and large reamers were then used. The tap was then removed and the Metaglene was then screwed in with good purchase.  The peripheral guide was then used to drilled measured and filled peripheral locking screws. The size  32-4 glenosphere was then impacted on the [facility] taper and the central screw was placed. The humerus was then again exposed and the diaphyseal reamers were used followed by the metaphyseal reamers. The final broach was left in place in the proximal trial was placed. The joint was reduced and with this implant it was felt that soft tissue tensioning was appropriate with excellent stability and excellent range of motion. Therefore, final humeral stem was placed press-fit.  And then the trial polyethylene inserts were tested again and the above implant was felt to be the most appropriate for final insertion. The joint was reduced taken through full range of motion and felt to be stable. Soft tissue tension was appropriate.  The joint was then copiously irrigated with pulse  lavage and the wound was then closed. The subscapularis was not repaired.  Skin was closed with 2-0 Vicryl in a deep dermal layer and 4-0  Monocryl for skin closure. Steri-Strips were applied. Sterile  dressings were then applied as well as a sling. The patient was allowed  to awaken from general anesthesia, transferred to stretcher, and taken  to recovery room in stable condition.   POSTOPERATIVE PLAN: The patient will be observed in the recovery room and if her pain is well-controlled with renal regional anesthesia and she is hemodynamically stable she can be discharged home today with family.

## 2022-08-06 NOTE — Evaluation (Signed)
Occupational Therapy Evaluation Patient Details Name: Alicia Klein MRN: [id number] DOB: 1955-05-17 Today's Date: 08/06/2022   History of Present Illness Ms. Livas is a 67 yr old who is s/p a R reverse shoulder arthroplasty on 08-06-22, due to failed rotator cuff repair with glenohumeral arthopathy.   Clinical Impression   Pt is s/p shoulder replacement on 08-06-22. Therapist provided education and instruction to patient and her daughter with regards to RUE ROM/exercises, post-op precautions, UE positioning, donning upper extremity clothing, recommendations for bathing while maintaining shoulder precautions, use of ice for pain and edema management, correct use of ice machine, and correctly donning/doffing sling. Patient and her daughter verbalized and demonstrated understanding as needed. Patient needed assistance to donn shirt, underwear, pants, socks and shoes, with instruction provided on compensatory strategies to perform ADLs. Patient to follow up with MD for further therapy needs.        Recommendations for follow up therapy are one component of a multi-disciplinary discharge planning process, led by the attending physician.  Recommendations may be updated based on patient status, additional functional criteria and insurance authorization.   Assistance Recommended at Discharge Intermittent Supervision/Assistance  Patient can return home with the following Assistance with cooking/housework;A little help with bathing/dressing/bathroom;Help with stairs or ramp for entrance;Assist for transportation    Functional Status Assessment  Patient has had a recent decline in their functional status and demonstrates the ability to make significant improvements in function in a reasonable and predictable amount of time.  Equipment Recommendations  None recommended by OT    Recommendations for Other Services       Precautions / Restrictions Precautions Precautions: Shoulder Type of Shoulder  Precautions: Sling to worn at all times except ADLs and exercise, okay to perform AROM of R hand, wrist, and elbow, no shoulder ROM Shoulder Interventions: Shoulder sling/immobilizer Precaution Booklet Issued: Yes (comment) Required Braces or Orthoses: Sling Restrictions Weight Bearing Restrictions: Yes RUE Weight Bearing: Non weight bearing      Mobility Bed Mobility      General bed mobility comments: Pt was received seated in the chair    Transfers Overall transfer level: Needs assistance Equipment used: None Transfers: Sit to/from Stand Sit to Stand: Supervision                  Balance Overall balance assessment: Mild deficits observed, not formally tested           ADL either performed or assessed with clinical judgement      Pertinent Vitals/Pain Pain Assessment Pain Assessment: No/denies pain     Hand Dominance Right      Communication Communication Communication: No difficulties   Cognition Arousal/Alertness: Awake/alert Behavior During Therapy: WFL for tasks assessed/performed Overall Cognitive Status: Within Functional Limits for tasks assessed          General Comments: Oriented x4, able to follow commands without difficulty           Shoulder Instructions Shoulder Instructions Donning/doffing shirt without moving shoulder: Caregiver independent with task Method for sponge bathing under operated UE: Caregiver independent with task Donning/doffing sling/immobilizer: Minimal assistance (with pt's daughter performing) Correct positioning of sling/immobilizer: Minimal assistance (with pt's daughter performing) Pendulum exercises (written home exercise program):  (N/A) ROM for elbow, wrist and digits of operated UE: Caregiver independent with task Sling wearing schedule (on at all times/off for ADL's): Caregiver independent with task Proper positioning of operated UE when showering: Caregiver independent with task Dressing change:   (N/A) Positioning of UE while  sleeping: Caregiver independent with task    Home Living Family/patient expects to be discharged to:: Private residence Living Arrangements: Alone. Her daughter lives next door.    Type of Home: House Home Access: Stairs to enter [employer] of Steps: 3 Entrance Stairs-Rails: Left Home Layout: One level     Bathroom Shower/Tub: Walk-in shower         Home Equipment: Educational psychologist (4 wheels);Cane - single point          Prior Functioning/Environment Prior Level of Function : Independent/Modified Independent             Mobility Comments: She uses a cane for ambulation. ADLs Comments: She was modified independent to independent with ADLs, cooking, and driving. Her daughter assists with cleaning.        OT Problem List: Impaired UE functional use            OT Frequency:  N/A       AM-PAC OT "6 Clicks" Daily Activity     Outcome Measure Help from another person eating meals?: None Help from another person taking care of personal grooming?: None Help from another person toileting, which includes using toliet, bedpan, or urinal?: A Little Help from another person bathing (including washing, rinsing, drying)?: A Little Help from another person to put on and taking off regular upper body clothing?: A Little Help from another person to put on and taking off regular lower body clothing?: A Little 6 Click Score: 20   End of Session Equipment Utilized During Treatment: Other (comment) (none) Nurse Communication: Other (comment) (shoulder education completed)  Activity Tolerance: Patient tolerated treatment well Patient left: in chair;with call bell/phone within reach;with family/visitor present;with nursing/sitter in room  OT Visit Diagnosis: Muscle weakness (generalized) (M62.81)                Time: 1610-9604 OT Time Calculation (min): 28 min Charges:  OT General Charges $OT Visit: 1 Visit OT Evaluation $OT  Eval Low Complexity: 1 Low OT Treatments $Self Care/Home Management : 8-22 mins    Reuben Likes, OTR/L 08/06/2022, 3:23 PM

## 2022-08-06 NOTE — Anesthesia Procedure Notes (Signed)
Procedure Name: Intubation Date/Time: 08/06/2022 9:42 AM  Performed by: Donna Bernard, CRNAPre-anesthesia Checklist: Patient identified, Emergency Drugs available, Suction available, Patient being monitored and Timeout performed Patient Re-evaluated:Patient Re-evaluated prior to induction Oxygen Delivery Method: Circle system utilized Preoxygenation: Pre-oxygenation with 100% oxygen Induction Type: IV induction Ventilation: Mask ventilation without difficulty Laryngoscope Size: 3, Mac and Glidescope Grade View: Grade I Tube type: Oral Tube size: 7.0 mm Number of attempts: 1 Airway Equipment and Method: Stylet Placement Confirmation: positive ETCO2, ETT inserted through vocal cords under direct vision, CO2 detector and breath sounds checked- equal and bilateral Secured at: 22 cm Tube secured with: Tape Dental Injury: Teeth and Oropharynx as per pre-operative assessment

## 2022-08-06 NOTE — H&P (Signed)
Alicia Klein is an 67 y.o. female.   Chief Complaint: R shoulder pain and dysfunction HPI: s/p R shoulder failed rotator cuff repair with ongoing pain and weakness, failed conservative treatment.  Past Medical History:  Diagnosis Date   Anemia    During pregnancy   Anxiety    Arthritis    Depression    Essential tremor    Fibromyalgia    GERD (gastroesophageal reflux disease)    Headache    hx of migraines    Hypertension    Pre-diabetes    PVC (premature ventricular contraction)    Sleep apnea    cpap - does not know settings   Vertigo     Past Surgical History:  Procedure Laterality Date   CARPAL TUNNEL RELEASE     both wrists   COLONOSCOPY WITH PROPOFOL N/A 12/04/2015   Procedure: COLONOSCOPY WITH PROPOFOL;  Surgeon: Graylin Shiver, MD;  Location: [facility];  Service: Endoscopy;  Laterality: N/A;   ELBOW SURGERY Left    KNEE SURGERY     right knee   right ankle surgery      ROTATOR CUFF REPAIR Bilateral     Family History  Problem Relation Age of Onset   Dystonia Mother    Breast cancer Mother    Heart disease Father    Diabetes Sister    COPD Sister    Cerebral aneurysm Sister    Social History:  reports that she has never smoked. She has never used smokeless tobacco. She reports that she does not drink alcohol and does not use drugs.  Allergies:  Allergies  Allergen Reactions   Sulfa Antibiotics Nausea And Vomiting   Amoxicillin Hives and Rash    Has patient had a PCN reaction causing immediate rash, facial/tongue/throat swelling, SOB or lightheadedness with hypotension: {no Has patient had a PCN reaction causing severe rash involving mucus membranes or skin necrosis: {no Has patient had a PCN reaction that required hospitalization {no Has patient had a PCN reaction occurring within the last 10 years: {no If all of the above answers are "NO", then may proceed with Cephalosporin use.   Garlic Other (See Comments)    Causes knots in stomach    Onion Other (See Comments)    Causes knots in stomach   Percocet [Oxycodone-Acetaminophen] Nausea Only   Wellbutrin [Bupropion]     Emotional reaction/mood changes     Medications Prior to Admission  Medication Sig Dispense Refill   Acetaminophen (TYLENOL 8 HOUR ARTHRITIS PAIN PO) Take 1,300 mg by mouth 2 (two) times daily as needed (pain.).     aspirin EC 81 MG tablet Take 81 mg by mouth in the morning.     BIOTIN PO Take 1 tablet by mouth in the morning.     cholecalciferol (VITAMIN D) 1000 UNITS tablet Take 4,000 Units by mouth every evening.     citalopram (CELEXA) 40 MG tablet Take 40 mg by mouth at bedtime.      cyanocobalamin (VITAMIN B12) 1000 MCG tablet Take 1,000 mcg by mouth in the morning.     famotidine (PEPCID) 20 MG tablet Take 20 mg by mouth 2 (two) times daily as needed for indigestion or heartburn.     furosemide (LASIX) 80 MG tablet Take 80 mg by mouth in the morning.     gabapentin (NEURONTIN) 600 MG tablet Take 600 mg by mouth 3 (three) times daily.     HYDROcodone-acetaminophen (NORCO/VICODIN) 5-325 MG per tablet Take 1 tablet by  mouth every 8 (eight) hours as needed (pain.).     KLOR-CON M10 10 MEQ tablet Take 10 mEq by mouth in the morning.     Multiple Vitamins-Minerals (MULTIVITAMINS THER. W/MINERALS) TABS Take 1 tablet by mouth in the morning. Women's Senior Multivitamin     Omega-3 Fatty Acids (OMEGA 3 PO) Take 1,000 mg by mouth in the morning.     Polyvinyl Alcohol (LUBRICANT DROPS OP) Place 1-2 drops into both eyes 3 (three) times daily as needed (dry/irritated eyes.).     propranolol (INDERAL) 40 MG tablet Take 40 mg by mouth in the morning and at bedtime.     rosuvastatin (CRESTOR) 5 MG tablet Take 5 mg by mouth every Monday, Wednesday, and Friday. In the morning.     topiramate (TOPAMAX) 50 MG tablet Take 150 mg by mouth at bedtime. Migraines     baclofen (LIORESAL) 10 MG tablet Take 10 mg by mouth 2 (two) times daily as needed (breakthrough migraines.).  Limited to 2 episodes per week      No results found for this or any previous visit (from the past 48 hour(s)). No results found.  Review of Systems  All other systems reviewed and are negative.   Pulse (!) 55, temperature 97.9 F (36.6 C), temperature source Oral, resp. rate 19, height 5\' 4"  (1.626 m), weight 120.6 kg, SpO2 95%. Physical Exam HENT:     Head: Atraumatic.  Eyes:     Extraocular Movements: Extraocular movements intact.  Cardiovascular:     Pulses: Normal pulses.  Pulmonary:     Effort: Pulmonary effort is normal.  Musculoskeletal:     Comments: R shoulder pain with limited ROM. NVID.  Skin:    General: Skin is warm and dry.  Neurological:     Mental Status: She is alert.      Assessment/Plan s/p R shoulder failed rotator cuff repair with ongoing pain and weakness, failed conservative treatment. Plan R reverse TSA Risks / benefits of surgery discussed Consent on chart  NPO for OR Preop antibiotics   Glennon Hamilton, MD 08/06/2022, 8:41 AM

## 2022-08-06 NOTE — Anesthesia Procedure Notes (Signed)
Anesthesia Regional Block: Interscalene brachial plexus block   Pre-Anesthetic Checklist: , timeout performed,  Correct Patient, Correct Site, Correct Laterality,  Correct Procedure, Correct Position, site marked,  Risks and benefits discussed,  Surgical consent,  Pre-op evaluation,  At surgeon's request and post-op pain management  Laterality: Right  Prep: chloraprep       Needles:  Injection technique: Single-shot  Needle Type: Echogenic Needle     Needle Length: 5cm  Needle Gauge: 21     Additional Needles:   Narrative:  Start time: 08/06/2022 9:05 AM End time: 08/06/2022 9:08 AM Injection made incrementally with aspirations every 5 mL.  Performed by: Personally  Anesthesiologist: Beryle Lathe, MD  Additional Notes: No pain on injection. No increased resistance to injection. Injection made in 5cc increments. Good needle visualization. Patient tolerated the procedure well.

## 2022-08-06 NOTE — Anesthesia Postprocedure Evaluation (Signed)
Anesthesia Post Note  Patient: Alicia Klein  Procedure(s) Performed: REVERSE SHOULDER ARTHROPLASTY (Right: Shoulder)     Patient location during evaluation: PACU Anesthesia Type: General Level of consciousness: awake and alert Pain management: pain level controlled Vital Signs Assessment: post-procedure vital signs reviewed and stable Respiratory status: spontaneous breathing, nonlabored ventilation, respiratory function stable and patient connected to nasal cannula oxygen Cardiovascular status: blood pressure returned to baseline and stable Postop Assessment: no apparent nausea or vomiting Anesthetic complications: no   No notable events documented.  Last Vitals:  Vitals:   08/06/22 1323 08/06/22 1400  BP: 136/84 124/79  Pulse: 70 73  Resp: 20 13  Temp: 36.5 C   SpO2: 94% 92%    Last Pain:  Vitals:   08/06/22 1400  TempSrc:   PainSc: 0-No pain                 Beryle Lathe

## 2022-08-06 NOTE — Transfer of Care (Signed)
Immediate Anesthesia Transfer of Care Note  Patient: Alicia Klein  Procedure(s) Performed: REVERSE SHOULDER ARTHROPLASTY (Right: Shoulder)  Patient Location: PACU  Anesthesia Type:GA combined with regional for post-op pain  Level of Consciousness: awake, drowsy, and patient cooperative  Airway & Oxygen Therapy: Patient connected to face mask oxygen  Post-op Assessment: Report given to RN and Post -op Vital signs reviewed and stable  Post vital signs: stable  Last Vitals:  Vitals Value Taken Time  BP 144/78 08/06/22 1111  Temp    Pulse 66 08/06/22 1113  Resp 21 08/06/22 1113  SpO2 91 % 08/06/22 1113  Vitals shown include unfiled device data.  Last Pain:  Vitals:   08/06/22 0721  TempSrc: Oral         Complications: No notable events documented.

## 2022-08-07 ENCOUNTER — Encounter (HOSPITAL_COMMUNITY): Payer: Self-pay | Admitting: Orthopedic Surgery

## 2022-08-18 DIAGNOSIS — M47812 Spondylosis without myelopathy or radiculopathy, cervical region: Secondary | ICD-10-CM | POA: Diagnosis not present

## 2022-08-18 DIAGNOSIS — M797 Fibromyalgia: Secondary | ICD-10-CM | POA: Diagnosis not present

## 2022-08-18 DIAGNOSIS — M47816 Spondylosis without myelopathy or radiculopathy, lumbar region: Secondary | ICD-10-CM | POA: Diagnosis not present

## 2022-08-18 DIAGNOSIS — M5416 Radiculopathy, lumbar region: Secondary | ICD-10-CM | POA: Diagnosis not present

## 2022-08-19 DIAGNOSIS — M75121 Complete rotator cuff tear or rupture of right shoulder, not specified as traumatic: Secondary | ICD-10-CM | POA: Diagnosis not present

## 2022-08-25 DIAGNOSIS — M25511 Pain in right shoulder: Secondary | ICD-10-CM | POA: Diagnosis not present

## 2022-08-25 DIAGNOSIS — R531 Weakness: Secondary | ICD-10-CM | POA: Diagnosis not present

## 2022-08-25 DIAGNOSIS — M25611 Stiffness of right shoulder, not elsewhere classified: Secondary | ICD-10-CM | POA: Diagnosis not present

## 2022-08-27 DIAGNOSIS — M25611 Stiffness of right shoulder, not elsewhere classified: Secondary | ICD-10-CM | POA: Diagnosis not present

## 2022-08-27 DIAGNOSIS — R531 Weakness: Secondary | ICD-10-CM | POA: Diagnosis not present

## 2022-08-27 DIAGNOSIS — M25511 Pain in right shoulder: Secondary | ICD-10-CM | POA: Diagnosis not present

## 2022-09-01 DIAGNOSIS — R531 Weakness: Secondary | ICD-10-CM | POA: Diagnosis not present

## 2022-09-01 DIAGNOSIS — M25511 Pain in right shoulder: Secondary | ICD-10-CM | POA: Diagnosis not present

## 2022-09-01 DIAGNOSIS — M25611 Stiffness of right shoulder, not elsewhere classified: Secondary | ICD-10-CM | POA: Diagnosis not present

## 2022-09-03 DIAGNOSIS — M79632 Pain in left forearm: Secondary | ICD-10-CM | POA: Diagnosis not present

## 2022-09-09 DIAGNOSIS — M25511 Pain in right shoulder: Secondary | ICD-10-CM | POA: Diagnosis not present

## 2022-09-09 DIAGNOSIS — R531 Weakness: Secondary | ICD-10-CM | POA: Diagnosis not present

## 2022-09-09 DIAGNOSIS — M25611 Stiffness of right shoulder, not elsewhere classified: Secondary | ICD-10-CM | POA: Diagnosis not present

## 2022-09-11 DIAGNOSIS — R531 Weakness: Secondary | ICD-10-CM | POA: Diagnosis not present

## 2022-09-11 DIAGNOSIS — M25511 Pain in right shoulder: Secondary | ICD-10-CM | POA: Diagnosis not present

## 2022-09-11 DIAGNOSIS — M25611 Stiffness of right shoulder, not elsewhere classified: Secondary | ICD-10-CM | POA: Diagnosis not present

## 2022-09-15 DIAGNOSIS — M25611 Stiffness of right shoulder, not elsewhere classified: Secondary | ICD-10-CM | POA: Diagnosis not present

## 2022-09-15 DIAGNOSIS — M25511 Pain in right shoulder: Secondary | ICD-10-CM | POA: Diagnosis not present

## 2022-09-15 DIAGNOSIS — R531 Weakness: Secondary | ICD-10-CM | POA: Diagnosis not present

## 2022-09-16 DIAGNOSIS — R531 Weakness: Secondary | ICD-10-CM | POA: Diagnosis not present

## 2022-09-16 DIAGNOSIS — M25611 Stiffness of right shoulder, not elsewhere classified: Secondary | ICD-10-CM | POA: Diagnosis not present

## 2022-09-16 DIAGNOSIS — M25511 Pain in right shoulder: Secondary | ICD-10-CM | POA: Diagnosis not present

## 2022-09-23 DIAGNOSIS — R531 Weakness: Secondary | ICD-10-CM | POA: Diagnosis not present

## 2022-09-23 DIAGNOSIS — M25611 Stiffness of right shoulder, not elsewhere classified: Secondary | ICD-10-CM | POA: Diagnosis not present

## 2022-09-23 DIAGNOSIS — M25511 Pain in right shoulder: Secondary | ICD-10-CM | POA: Diagnosis not present

## 2022-09-24 DIAGNOSIS — M25511 Pain in right shoulder: Secondary | ICD-10-CM | POA: Diagnosis not present

## 2022-09-24 DIAGNOSIS — M25611 Stiffness of right shoulder, not elsewhere classified: Secondary | ICD-10-CM | POA: Diagnosis not present

## 2022-09-24 DIAGNOSIS — R531 Weakness: Secondary | ICD-10-CM | POA: Diagnosis not present

## 2022-09-29 DIAGNOSIS — M25611 Stiffness of right shoulder, not elsewhere classified: Secondary | ICD-10-CM | POA: Diagnosis not present

## 2022-09-29 DIAGNOSIS — M25511 Pain in right shoulder: Secondary | ICD-10-CM | POA: Diagnosis not present

## 2022-09-29 DIAGNOSIS — R531 Weakness: Secondary | ICD-10-CM | POA: Diagnosis not present

## 2022-09-30 DIAGNOSIS — G5602 Carpal tunnel syndrome, left upper limb: Secondary | ICD-10-CM | POA: Diagnosis not present

## 2022-10-01 DIAGNOSIS — M25611 Stiffness of right shoulder, not elsewhere classified: Secondary | ICD-10-CM | POA: Diagnosis not present

## 2022-10-01 DIAGNOSIS — M25511 Pain in right shoulder: Secondary | ICD-10-CM | POA: Diagnosis not present

## 2022-10-01 DIAGNOSIS — R531 Weakness: Secondary | ICD-10-CM | POA: Diagnosis not present

## 2022-10-05 DIAGNOSIS — M25611 Stiffness of right shoulder, not elsewhere classified: Secondary | ICD-10-CM | POA: Diagnosis not present

## 2022-10-05 DIAGNOSIS — M25511 Pain in right shoulder: Secondary | ICD-10-CM | POA: Diagnosis not present

## 2022-10-05 DIAGNOSIS — R531 Weakness: Secondary | ICD-10-CM | POA: Diagnosis not present

## 2022-10-06 DIAGNOSIS — M170 Bilateral primary osteoarthritis of knee: Secondary | ICD-10-CM | POA: Diagnosis not present

## 2022-10-06 DIAGNOSIS — M67912 Unspecified disorder of synovium and tendon, left shoulder: Secondary | ICD-10-CM | POA: Diagnosis not present

## 2022-10-06 DIAGNOSIS — M25522 Pain in left elbow: Secondary | ICD-10-CM | POA: Diagnosis not present

## 2022-10-07 DIAGNOSIS — R531 Weakness: Secondary | ICD-10-CM | POA: Diagnosis not present

## 2022-10-07 DIAGNOSIS — M25611 Stiffness of right shoulder, not elsewhere classified: Secondary | ICD-10-CM | POA: Diagnosis not present

## 2022-10-07 DIAGNOSIS — M25511 Pain in right shoulder: Secondary | ICD-10-CM | POA: Diagnosis not present

## 2022-10-12 DIAGNOSIS — M25611 Stiffness of right shoulder, not elsewhere classified: Secondary | ICD-10-CM | POA: Diagnosis not present

## 2022-10-12 DIAGNOSIS — M25511 Pain in right shoulder: Secondary | ICD-10-CM | POA: Diagnosis not present

## 2022-10-12 DIAGNOSIS — R531 Weakness: Secondary | ICD-10-CM | POA: Diagnosis not present

## 2022-10-14 DIAGNOSIS — M25611 Stiffness of right shoulder, not elsewhere classified: Secondary | ICD-10-CM | POA: Diagnosis not present

## 2022-10-14 DIAGNOSIS — M25511 Pain in right shoulder: Secondary | ICD-10-CM | POA: Diagnosis not present

## 2022-10-14 DIAGNOSIS — R531 Weakness: Secondary | ICD-10-CM | POA: Diagnosis not present

## 2022-10-19 DIAGNOSIS — R531 Weakness: Secondary | ICD-10-CM | POA: Diagnosis not present

## 2022-10-19 DIAGNOSIS — M25511 Pain in right shoulder: Secondary | ICD-10-CM | POA: Diagnosis not present

## 2022-10-19 DIAGNOSIS — M25611 Stiffness of right shoulder, not elsewhere classified: Secondary | ICD-10-CM | POA: Diagnosis not present

## 2022-10-21 DIAGNOSIS — M25611 Stiffness of right shoulder, not elsewhere classified: Secondary | ICD-10-CM | POA: Diagnosis not present

## 2022-10-21 DIAGNOSIS — M25511 Pain in right shoulder: Secondary | ICD-10-CM | POA: Diagnosis not present

## 2022-10-21 DIAGNOSIS — R531 Weakness: Secondary | ICD-10-CM | POA: Diagnosis not present

## 2022-10-26 DIAGNOSIS — M25511 Pain in right shoulder: Secondary | ICD-10-CM | POA: Diagnosis not present

## 2022-10-30 DIAGNOSIS — M25511 Pain in right shoulder: Secondary | ICD-10-CM | POA: Diagnosis not present

## 2022-10-30 DIAGNOSIS — R531 Weakness: Secondary | ICD-10-CM | POA: Diagnosis not present

## 2022-10-30 DIAGNOSIS — M25611 Stiffness of right shoulder, not elsewhere classified: Secondary | ICD-10-CM | POA: Diagnosis not present

## 2022-11-02 DIAGNOSIS — M25611 Stiffness of right shoulder, not elsewhere classified: Secondary | ICD-10-CM | POA: Diagnosis not present

## 2022-11-02 DIAGNOSIS — M25511 Pain in right shoulder: Secondary | ICD-10-CM | POA: Diagnosis not present

## 2022-11-02 DIAGNOSIS — R531 Weakness: Secondary | ICD-10-CM | POA: Diagnosis not present

## 2022-11-03 DIAGNOSIS — M65342 Trigger finger, left ring finger: Secondary | ICD-10-CM | POA: Diagnosis not present

## 2022-11-03 DIAGNOSIS — M65312 Trigger thumb, left thumb: Secondary | ICD-10-CM | POA: Diagnosis not present

## 2022-11-03 DIAGNOSIS — G5622 Lesion of ulnar nerve, left upper limb: Secondary | ICD-10-CM | POA: Diagnosis not present

## 2022-11-05 DIAGNOSIS — M47812 Spondylosis without myelopathy or radiculopathy, cervical region: Secondary | ICD-10-CM | POA: Diagnosis not present

## 2022-11-05 DIAGNOSIS — M47816 Spondylosis without myelopathy or radiculopathy, lumbar region: Secondary | ICD-10-CM | POA: Diagnosis not present

## 2022-11-05 DIAGNOSIS — M797 Fibromyalgia: Secondary | ICD-10-CM | POA: Diagnosis not present

## 2022-11-06 DIAGNOSIS — R531 Weakness: Secondary | ICD-10-CM | POA: Diagnosis not present

## 2022-11-06 DIAGNOSIS — M25511 Pain in right shoulder: Secondary | ICD-10-CM | POA: Diagnosis not present

## 2022-11-06 DIAGNOSIS — M25611 Stiffness of right shoulder, not elsewhere classified: Secondary | ICD-10-CM | POA: Diagnosis not present

## 2022-11-09 DIAGNOSIS — M25611 Stiffness of right shoulder, not elsewhere classified: Secondary | ICD-10-CM | POA: Diagnosis not present

## 2022-11-09 DIAGNOSIS — M25511 Pain in right shoulder: Secondary | ICD-10-CM | POA: Diagnosis not present

## 2022-11-09 DIAGNOSIS — R531 Weakness: Secondary | ICD-10-CM | POA: Diagnosis not present

## 2022-11-18 DIAGNOSIS — R531 Weakness: Secondary | ICD-10-CM | POA: Diagnosis not present

## 2022-11-18 DIAGNOSIS — M25511 Pain in right shoulder: Secondary | ICD-10-CM | POA: Diagnosis not present

## 2022-11-18 DIAGNOSIS — M25611 Stiffness of right shoulder, not elsewhere classified: Secondary | ICD-10-CM | POA: Diagnosis not present

## 2022-11-20 DIAGNOSIS — M25511 Pain in right shoulder: Secondary | ICD-10-CM | POA: Diagnosis not present

## 2022-11-20 DIAGNOSIS — R531 Weakness: Secondary | ICD-10-CM | POA: Diagnosis not present

## 2022-11-20 DIAGNOSIS — M25611 Stiffness of right shoulder, not elsewhere classified: Secondary | ICD-10-CM | POA: Diagnosis not present

## 2022-11-23 DIAGNOSIS — R531 Weakness: Secondary | ICD-10-CM | POA: Diagnosis not present

## 2022-11-23 DIAGNOSIS — M25511 Pain in right shoulder: Secondary | ICD-10-CM | POA: Diagnosis not present

## 2022-11-23 DIAGNOSIS — M25611 Stiffness of right shoulder, not elsewhere classified: Secondary | ICD-10-CM | POA: Diagnosis not present

## 2022-11-25 DIAGNOSIS — M25511 Pain in right shoulder: Secondary | ICD-10-CM | POA: Diagnosis not present

## 2022-11-25 DIAGNOSIS — M25611 Stiffness of right shoulder, not elsewhere classified: Secondary | ICD-10-CM | POA: Diagnosis not present

## 2022-11-25 DIAGNOSIS — R531 Weakness: Secondary | ICD-10-CM | POA: Diagnosis not present

## 2022-12-01 DIAGNOSIS — G5622 Lesion of ulnar nerve, left upper limb: Secondary | ICD-10-CM | POA: Diagnosis not present

## 2022-12-01 DIAGNOSIS — M65312 Trigger thumb, left thumb: Secondary | ICD-10-CM | POA: Diagnosis not present

## 2022-12-01 DIAGNOSIS — G5602 Carpal tunnel syndrome, left upper limb: Secondary | ICD-10-CM | POA: Diagnosis not present

## 2022-12-16 DIAGNOSIS — H40013 Open angle with borderline findings, low risk, bilateral: Secondary | ICD-10-CM | POA: Diagnosis not present

## 2022-12-24 DIAGNOSIS — G43719 Chronic migraine without aura, intractable, without status migrainosus: Secondary | ICD-10-CM | POA: Diagnosis not present

## 2023-01-25 ENCOUNTER — Encounter: Payer: Self-pay | Admitting: Neurology

## 2023-01-25 DIAGNOSIS — R7303 Prediabetes: Secondary | ICD-10-CM | POA: Diagnosis not present

## 2023-01-25 DIAGNOSIS — E782 Mixed hyperlipidemia: Secondary | ICD-10-CM | POA: Diagnosis not present

## 2023-01-25 DIAGNOSIS — M25511 Pain in right shoulder: Secondary | ICD-10-CM | POA: Diagnosis not present

## 2023-01-25 DIAGNOSIS — M797 Fibromyalgia: Secondary | ICD-10-CM | POA: Diagnosis not present

## 2023-01-25 DIAGNOSIS — G8929 Other chronic pain: Secondary | ICD-10-CM | POA: Diagnosis not present

## 2023-01-25 DIAGNOSIS — G250 Essential tremor: Secondary | ICD-10-CM | POA: Diagnosis not present

## 2023-01-25 DIAGNOSIS — G43909 Migraine, unspecified, not intractable, without status migrainosus: Secondary | ICD-10-CM | POA: Diagnosis not present

## 2023-01-25 DIAGNOSIS — I10 Essential (primary) hypertension: Secondary | ICD-10-CM | POA: Diagnosis not present

## 2023-01-28 DIAGNOSIS — G5622 Lesion of ulnar nerve, left upper limb: Secondary | ICD-10-CM | POA: Diagnosis not present

## 2023-01-29 DIAGNOSIS — M25511 Pain in right shoulder: Secondary | ICD-10-CM | POA: Diagnosis not present

## 2023-02-01 NOTE — Progress Notes (Unsigned)
Assessment/Plan:   1.  Parkinsonism  -likely idiopathic disease but cannot r/o atypical state.  She and I discussed this in detail today.  She is certainly bradykinetic.  She also has tremor.  Those alone meet the criteria for Parkinsons disease.  She reports that her mom had parkinsonism with dystonia, but she does not know the exact diagnosis.  I encouraged her to get genetic testing and gave her information to [employer] [facility] free testing.  -Discussed DaTscan and skin biopsies for alpha-synuclein.  I told her that this really would not change any of our treatments, but she would like to proceed with the skin biopsy for alpha-synuclein.  We will try to get that scheduled.  -She would like to go ahead and start her on levodopa.  She was given a titration schedule.  We will start and work our way to carbidopa/levodopa 25/100, 1 tablet at 7 AM/11 AM/4 PM.  -She was given information to community exercise programs.  -Patient reports that she is on the propranolol for both tremor and blood pressure.  Told her that while it can really help tremor, she is a bit bradycardic from it.  In addition, Parkinson's patients tend to have significantly lowered blood pressure because of dysautonomia.  She is going to talk to her primary care about potentially decreasing this medication.  She is already describing some lightheadedness/near syncope.  2.  Chronic pain/fibromyalgia  -Patient is following with Dr. Royetta Asal management   Subjective:   MARCE SCURRY was seen in consultation in the movement disorder clinic at the request of Noberto Retort, MD. patient is a 68 year old female with a history of pseudotumor cerebri (has seen Dr. Neale Burly), fibromyalgia (saw Dr. Murray Hodgkins in the past for pain management and now with Dr. Lorrine Kin), chronic pain syndrome, chronic fatigue, hyperlipidemia, hypertension, anxiety, depression who presents for the evaluation of her tremor.  I saw the patient for the same  thing about 4 years ago.  At that time, the patient didn't really have tremor of any kind on her examination but she was told to f/u if things should change/worsen.  Notes from PCP indicate she was sent back to r/o Parkinsons Disease. Pt states that tremor was just on the R but a few years ago it started on the R and now in the jaw.  She notes that she had R and L rotator cuff sx were done and then last July she had R reverse shoulder replacement and she had a bit more tremor after each surgery.  She is awaiting an ulnar transposition sx on the L as well.  No fam hx of tremor  Affected by caffeine:  No. Affected by alcohol: Does not drink alcohol Affected by stress:  yes Affected by fatigue:  yes Spills soup if on spoon:  she may have some trouble with this and generally drinks soup out of the mug Spills glass of liquid if full: May/may not Affects ADL's (tying shoes, brushing teeth, etc):  No.  Current/Previously tried tremor medications: Propranolol 40 mg twice per day; on gabapentin for pain management, 600 mg 3 times per day and Topamax for headache, 150 mg at bed; prior metoprolol  Other questions, unrelated to tremor: Voice: sounds good to patient but others tell her its softer Sleep:   Vivid Dreams:  No., generally feel like "dreams"  Acting out dreams:  sometimes Wet Pillows: Yes.   Postural symptoms:  Yes.   - some due to positional vertigo and other times she doesn't  know why she has bad balance - "its like I hit a hit a speed bump."  She may get the sensation that she will fall back but she doesn't.  She will catch herself.    Falls?  None in over a year - that was in the yard and she had trouble getting up and pulled up with help of a tree.  She has near falls Bradykinesia symptoms: difficulty getting out of a chair and difficulty regaining balance Loss of smell:  Yes.   Loss of taste:  Yes.   Urinary Incontinence: wears pad for dribbling Difficulty Swallowing:  No., generally  doing ok Handwriting, micrographia: Yes.   Trouble with ADL's:  slow esp due to reverse shoulder replacement last july Depression:  No. Memory changes:  some short term trouble Hallucinations:  No.  visual distortions: Yes.   N/V:  No. Lightheaded:  Yes.  , occ if gets up too fast  Syncope: No. Diplopia:  No. Dyskinesia:  No.   Allergies  Allergen Reactions   Sulfa Antibiotics Nausea And Vomiting   Amoxicillin Hives and Rash    Has patient had a PCN reaction causing immediate rash, facial/tongue/throat swelling, SOB or lightheadedness with hypotension: {no Has patient had a PCN reaction causing severe rash involving mucus membranes or skin necrosis: {no Has patient had a PCN reaction that required hospitalization {no Has patient had a PCN reaction occurring within the last 10 years: {no If all of the above answers are "NO", then may proceed with Cephalosporin use.   Garlic Other (See Comments)    Causes knots in stomach   Onion Other (See Comments)    Causes knots in stomach   Percocet [Oxycodone-Acetaminophen] Nausea Only   Wellbutrin [Bupropion]     Emotional reaction/mood changes     Current Meds  Medication Sig   Acetaminophen (TYLENOL 8 HOUR ARTHRITIS PAIN PO) Take 1,300 mg by mouth 2 (two) times daily as needed (pain.).   aspirin EC 81 MG tablet Take 81 mg by mouth in the morning.   baclofen (LIORESAL) 10 MG tablet Take 10 mg by mouth 2 (two) times daily as needed (breakthrough migraines.). Limited to 2 episodes per week   BIOTIN PO Take 1 tablet by mouth in the morning.   cholecalciferol (VITAMIN D) 1000 UNITS tablet Take 4,000 Units by mouth every evening.   citalopram (CELEXA) 40 MG tablet Take 40 mg by mouth daily.   cyanocobalamin (VITAMIN B12) 1000 MCG tablet Take 1,000 mcg by mouth in the morning.   famotidine (PEPCID) 20 MG tablet Take 20 mg by mouth 2 (two) times daily as needed for indigestion or heartburn.   furosemide (LASIX) 80 MG tablet Take 80 mg by mouth  in the morning.   gabapentin (NEURONTIN) 600 MG tablet Take 600 mg by mouth 3 (three) times daily.   HYDROcodone-acetaminophen (NORCO/VICODIN) 5-325 MG tablet Take 1-2 tablets by mouth every 6 (six) hours as needed (pain.).   KLOR-CON M10 10 MEQ tablet Take 10 mEq by mouth in the morning.   Multiple Vitamins-Minerals (MULTIVITAMINS THER. W/MINERALS) TABS Take 1 tablet by mouth in the morning. Women's Senior Multivitamin   Omega-3 Fatty Acids (OMEGA 3 PO) Take 1,000 mg by mouth in the morning.   Polyvinyl Alcohol (LUBRICANT DROPS OP) Place 1-2 drops into both eyes 3 (three) times daily as needed (dry/irritated eyes.).   propranolol (INDERAL) 40 MG tablet Take 40 mg by mouth in the morning and at bedtime.   rosuvastatin (CRESTOR) 5 MG  tablet Take 5 mg by mouth every Monday, Wednesday, and Friday. In the morning.   topiramate (TOPAMAX) 50 MG tablet Take 150 mg by mouth at bedtime. Migraines    Objective:   VITALS:   Vitals:   02/03/23 0954  BP: 120/74  Pulse: (!) 57  SpO2: 96%  Weight: 261 lb 3.2 oz (118.5 kg)  Height: 5' 4.5" (1.638 m)   Gen:  Appears stated age and in NAD. HEENT:  Normocephalic, atraumatic. The mucous membranes are moist. The superficial temporal arteries are without ropiness or tenderness. Cardiovascular: brady.  regular Lungs: Clear to auscultation bilaterally. Neck: There are no carotid bruits noted bilaterally.  NEUROLOGICAL:  Orientation:  The patient is alert and oriented x 3.   Cranial nerves: There is good facial symmetry. Extraocular muscles are intact and visual fields are full to confrontational testing. Speech is fluent and clear. Soft palate rises symmetrically and there is no tongue deviation. Hearing is intact to conversational tone. Tone: Tone is good throughout. Sensation: Sensation is intact to light touch touch throughout (facial, trunk, extremities). Vibration is intact at the bilateral big toe. There is no extinction with double simultaneous  stimulation. There is no sensory dermatomal level identified. Coordination:  The patient has no dysdiadichokinesia or dysmetria. Motor: Strength is at least 4/5 in the right upper extremity.  There are some giveaway weakness that does improve with encouragement.  She has some pain in the right arm because of prior shoulder surgery.  Strength is at least  5-/5 in the left upper and bilateral lower extremities, with some giveaway weakness that does improve with encouragement.  DTR's: Deep tendon reflexes are 1/4 at the bilateral biceps, triceps, brachioradialis, 0/4 patella and achilles.  Plantar responses are downgoing bilaterally. Gait and Station: The patient pushes off to arise.  She holds the cane in the L hand.  She has RUE rest tremor with virtually no arm swing.    MOVEMENT EXAM: Tremor:  There is bilateral UE rest tremor.  It doesn't really increase with distraction.  There is jaw tremor.  There is min intention tremor.  There is min postural tremor.    I have reviewed and interpreted the following labs independently   Chemistry      Component Value Date/Time   NA 137 07/24/2022 1110   K 3.6 07/24/2022 1110   CL 107 07/24/2022 1110   CO2 23 07/24/2022 1110   BUN 15 07/24/2022 1110   CREATININE 0.80 07/24/2022 1110      Component Value Date/Time   CALCIUM 9.2 07/24/2022 1110      Lab Results  Component Value Date   WBC 6.7 07/24/2022   HGB 14.6 07/24/2022   HCT 44.9 07/24/2022   MCV 93.7 07/24/2022   PLT 258 07/24/2022   No results found for: "TSH"    Total time spent on today's visit was , including both face-to-face time and nonface-to-face time.  Time included that spent on review of records (prior notes available to me/labs/imaging if pertinent), discussing treatment and goals, answering patient's questions and coordinating care.  CC:  Noberto Retort, MD

## 2023-02-03 ENCOUNTER — Encounter: Payer: Self-pay | Admitting: Neurology

## 2023-02-03 ENCOUNTER — Ambulatory Visit: Payer: Medicare Other | Admitting: Neurology

## 2023-02-03 VITALS — BP 120/74 | HR 57 | Ht 64.5 in | Wt 261.2 lb

## 2023-02-03 DIAGNOSIS — G20C Parkinsonism, unspecified: Secondary | ICD-10-CM | POA: Diagnosis not present

## 2023-02-03 MED ORDER — CARBIDOPA-LEVODOPA 25-100 MG PO TABS: 1.0000 | ORAL_TABLET | Freq: Three times a day (TID) | ORAL | 1 refills | Status: DC

## 2023-02-03 NOTE — Patient Instructions (Addendum)
Start Carbidopa Levodopa as follows: Take 1/2 tablet three times daily, at least 30 minutes before meals (approximately 7am/11am/4pm), for one week Then take 1/2 tablet in the morning, 1/2 tablet at 11am, 1 tablet at 4pm, at least 30 minutes before meals, for one week Then take 1/2 tablet in the morning, 1 tablet at 11am, 1 tablet at 4pm, at least 30 minutes before meals, for one week Then take 1 tablet three times daily at 7am/11am/4pm, at least 30 minutes before meals   As a reminder, carbidopa/levodopa can be taken at the same time as a carbohydrate, but we like to have you take your pill either 30 minutes before a protein source or 1 hour after as protein can interfere with carbidopa/levodopa absorption.   Please enroll yourself in the genetic study we discussed:  [email or web address]  We will get authorization for your skin biopsy.  The physicians and staff at [facility] are committed to providing excellent care. You may receive a survey requesting feedback about your experience at our office. We strive to receive "very good" responses to the survey questions. If you feel that your experience would prevent you from giving the office a "very good " response, please contact our office to try to remedy the situation. We may be reached at [phone]. Thank you for taking the time out of your busy day to complete the survey.

## 2023-02-12 DIAGNOSIS — M797 Fibromyalgia: Secondary | ICD-10-CM | POA: Diagnosis not present

## 2023-02-12 DIAGNOSIS — M47816 Spondylosis without myelopathy or radiculopathy, lumbar region: Secondary | ICD-10-CM | POA: Diagnosis not present

## 2023-02-12 DIAGNOSIS — M47812 Spondylosis without myelopathy or radiculopathy, cervical region: Secondary | ICD-10-CM | POA: Diagnosis not present

## 2023-02-24 DIAGNOSIS — G5622 Lesion of ulnar nerve, left upper limb: Secondary | ICD-10-CM | POA: Diagnosis not present

## 2023-03-17 DIAGNOSIS — H40013 Open angle with borderline findings, low risk, bilateral: Secondary | ICD-10-CM | POA: Diagnosis not present

## 2023-03-19 ENCOUNTER — Ambulatory Visit (INDEPENDENT_AMBULATORY_CARE_PROVIDER_SITE_OTHER): Payer: Medicare Other | Admitting: Neurology

## 2023-03-19 DIAGNOSIS — G20C Parkinsonism, unspecified: Secondary | ICD-10-CM

## 2023-03-19 NOTE — Procedures (Signed)
 Punch Biopsy Procedure Note  Preprocedure Diagnosis: Bradykinesia; Tremor;   Postprocedure Diagnosis: same  Locations: Site 1: right posterior cervical Site 2: above, right  knee;  Site 3: above, right foot  Indications: r/o alpha synucleinopathy  Anesthesia: 3 mL Lidocaine 1% with epinephrine without added sodium bicarbonate  Procedure Details Patient informed of the risks (including but not limited to bleeding, pain, infection, scar and infection) and benefits of the procedure.  Informed consent obtained.  The areas which were chosen for biopsy, as above, and surrounding areas were given a sterile prep using betadyne and draped in the usual sterile fashion. The skin was then stretched perpendicular to the skin tension lines and sample removed using the 3 mm punch. Pressure applied, hemostasis achieved.   Dressing applied. The specimen(s) was sent for pathologic examination. The patient tolerated the procedure well.  Estimated Blood Loss: 0 ml  Condition: Stable  Complications: none.  Plan: 1. Instructed to keep the wound dry and covered for 24-48h and clean thereafter. 2. Warning signs of infection were reviewed.

## 2023-04-01 DIAGNOSIS — M65312 Trigger thumb, left thumb: Secondary | ICD-10-CM | POA: Diagnosis not present

## 2023-04-06 DIAGNOSIS — H40013 Open angle with borderline findings, low risk, bilateral: Secondary | ICD-10-CM | POA: Diagnosis not present

## 2023-04-09 ENCOUNTER — Telehealth: Payer: Self-pay | Admitting: Neurology

## 2023-04-09 DIAGNOSIS — G20C Parkinsonism, unspecified: Secondary | ICD-10-CM | POA: Diagnosis not present

## 2023-04-09 NOTE — Telephone Encounter (Signed)
 Called and talked to patient about test results staying on meds and talking at next appointment

## 2023-04-09 NOTE — Telephone Encounter (Signed)
 Pts skin biopsy came back.  There was:  No evidence of alpha synuclein in the cutaneous nerves 2.  No evidence of small fiber neuropathy 3.  No evidence of amyloid deposition within the cutaneous nerves  Please call pt/family and let them know that Alicia Klein skin biopsy was normal.  This doesn't change our treatement.  We will talk at our appt in few months but continue levodopa until then

## 2023-04-29 DIAGNOSIS — M65342 Trigger finger, left ring finger: Secondary | ICD-10-CM | POA: Diagnosis not present

## 2023-05-10 DIAGNOSIS — H40013 Open angle with borderline findings, low risk, bilateral: Secondary | ICD-10-CM | POA: Diagnosis not present

## 2023-05-14 DIAGNOSIS — M797 Fibromyalgia: Secondary | ICD-10-CM | POA: Diagnosis not present

## 2023-05-14 DIAGNOSIS — M47816 Spondylosis without myelopathy or radiculopathy, lumbar region: Secondary | ICD-10-CM | POA: Diagnosis not present

## 2023-05-24 ENCOUNTER — Other Ambulatory Visit: Payer: Self-pay | Admitting: Family Medicine

## 2023-05-24 DIAGNOSIS — Z1231 Encounter for screening mammogram for malignant neoplasm of breast: Secondary | ICD-10-CM

## 2023-05-26 ENCOUNTER — Ambulatory Visit
Admission: RE | Admit: 2023-05-26 | Discharge: 2023-05-26 | Disposition: A | Discharge status: Home / Self Care | Source: Ambulatory Visit | Attending: Orthopedic Surgery | Admitting: Orthopedic Surgery

## 2023-05-26 ENCOUNTER — Other Ambulatory Visit: Payer: Self-pay | Admitting: Orthopedic Surgery

## 2023-05-26 DIAGNOSIS — M25511 Pain in right shoulder: Secondary | ICD-10-CM | POA: Diagnosis not present

## 2023-05-26 DIAGNOSIS — Z96611 Presence of right artificial shoulder joint: Secondary | ICD-10-CM

## 2023-05-26 DIAGNOSIS — M19011 Primary osteoarthritis, right shoulder: Secondary | ICD-10-CM | POA: Diagnosis not present

## 2023-06-09 DIAGNOSIS — M25511 Pain in right shoulder: Secondary | ICD-10-CM | POA: Diagnosis not present

## 2023-06-10 DIAGNOSIS — M7711 Lateral epicondylitis, right elbow: Secondary | ICD-10-CM | POA: Diagnosis not present

## 2023-06-12 DIAGNOSIS — E782 Mixed hyperlipidemia: Secondary | ICD-10-CM | POA: Diagnosis not present

## 2023-06-12 DIAGNOSIS — I10 Essential (primary) hypertension: Secondary | ICD-10-CM | POA: Diagnosis not present

## 2023-06-14 DIAGNOSIS — R531 Weakness: Secondary | ICD-10-CM | POA: Diagnosis not present

## 2023-06-14 DIAGNOSIS — M25611 Stiffness of right shoulder, not elsewhere classified: Secondary | ICD-10-CM | POA: Diagnosis not present

## 2023-06-14 DIAGNOSIS — M25511 Pain in right shoulder: Secondary | ICD-10-CM | POA: Diagnosis not present

## 2023-06-16 DIAGNOSIS — M25611 Stiffness of right shoulder, not elsewhere classified: Secondary | ICD-10-CM | POA: Diagnosis not present

## 2023-06-16 DIAGNOSIS — R531 Weakness: Secondary | ICD-10-CM | POA: Diagnosis not present

## 2023-06-16 DIAGNOSIS — M25511 Pain in right shoulder: Secondary | ICD-10-CM | POA: Diagnosis not present

## 2023-06-21 DIAGNOSIS — M25611 Stiffness of right shoulder, not elsewhere classified: Secondary | ICD-10-CM | POA: Diagnosis not present

## 2023-06-21 DIAGNOSIS — M25511 Pain in right shoulder: Secondary | ICD-10-CM | POA: Diagnosis not present

## 2023-06-21 DIAGNOSIS — R531 Weakness: Secondary | ICD-10-CM | POA: Diagnosis not present

## 2023-06-23 DIAGNOSIS — M25611 Stiffness of right shoulder, not elsewhere classified: Secondary | ICD-10-CM | POA: Diagnosis not present

## 2023-06-23 DIAGNOSIS — R531 Weakness: Secondary | ICD-10-CM | POA: Diagnosis not present

## 2023-06-23 DIAGNOSIS — M25511 Pain in right shoulder: Secondary | ICD-10-CM | POA: Diagnosis not present

## 2023-06-25 DIAGNOSIS — G43719 Chronic migraine without aura, intractable, without status migrainosus: Secondary | ICD-10-CM | POA: Diagnosis not present

## 2023-06-29 DIAGNOSIS — M25611 Stiffness of right shoulder, not elsewhere classified: Secondary | ICD-10-CM | POA: Diagnosis not present

## 2023-06-29 DIAGNOSIS — R531 Weakness: Secondary | ICD-10-CM | POA: Diagnosis not present

## 2023-06-29 DIAGNOSIS — M25511 Pain in right shoulder: Secondary | ICD-10-CM | POA: Diagnosis not present

## 2023-07-02 DIAGNOSIS — M461 Sacroiliitis, not elsewhere classified: Secondary | ICD-10-CM | POA: Diagnosis not present

## 2023-07-05 ENCOUNTER — Ambulatory Visit

## 2023-07-06 DIAGNOSIS — R531 Weakness: Secondary | ICD-10-CM | POA: Diagnosis not present

## 2023-07-06 DIAGNOSIS — M25511 Pain in right shoulder: Secondary | ICD-10-CM | POA: Diagnosis not present

## 2023-07-06 DIAGNOSIS — M25611 Stiffness of right shoulder, not elsewhere classified: Secondary | ICD-10-CM | POA: Diagnosis not present

## 2023-07-06 NOTE — Progress Notes (Unsigned)
 Assessment/Plan:   1.  Parkinsonism  -likely idiopathic disease but cannot r/o atypical state.  She and I discussed this in detail today.  She is certainly bradykinetic.  She also has tremor.  Those alone meet the criteria for Parkinsons disease.  She reports that her mom had parkinsonism with dystonia, but she does not know the exact diagnosis.  I encouraged her to get genetic testing and gave her information to [employer]'s [employer] free testing.  - Skin biopsy for alpha-synuclein March, 2025 was normal.  -***Carbidopa /levodopa  25/100, 1 tablet 3 times per day  -She was given information to community exercise programs.  -Patient reports that she is on the propranolol for both tremor and blood pressure.  Told her that while it can really help tremor, she is a bit bradycardic from it.  In addition, Parkinson's patients tend to have significantly lowered blood pressure because of dysautonomia.  She is going to talk to her primary care about potentially decreasing this medication.  She is already describing some lightheadedness/near syncope.  2.  Chronic pain/fibromyalgia  -Patient is following with Dr. Jennette management   Subjective:   ETOILE LOOMAN was seen in follow-up today.  Last visit, we did go ahead and start her on levodopa .  She reports today that ***.  since last visit, she did have skin biopsy for alpha-synuclein.  This was done on March 7.  There was no evidence of alpha-synuclein in the cutaneous nerves and no evidence of small fiber neuropathy.  There is no evidence of amyloid deposition in the cutaneous nerves either.  Current/Previously tried tremor medications: Propranolol 40 mg twice per day; on gabapentin for pain management, 600 mg 3 times per day and Topamax for headache, 150 mg at bed; prior metoprolol   Allergies  Allergen Reactions   Sulfa Antibiotics Nausea And Vomiting   Amoxicillin Hives and Rash    Has patient had a PCN reaction causing immediate rash,  facial/tongue/throat swelling, SOB or lightheadedness with hypotension: {no Has patient had a PCN reaction causing severe rash involving mucus membranes or skin necrosis: {no Has patient had a PCN reaction that required hospitalization {no Has patient had a PCN reaction occurring within the last 10 years: {no If all of the above answers are NO, then may proceed with Cephalosporin use.   Garlic Other (See Comments)    Causes knots in stomach   Onion Other (See Comments)    Causes knots in stomach   Percocet [Oxycodone-Acetaminophen ] Nausea Only   Wellbutrin [Bupropion]     Emotional reaction/mood changes     No outpatient medications have been marked as taking for the 07/08/23 encounter (Appointment) with Husayn Reim, Asberry RAMAN, DO.    Objective:   VITALS:   There were no vitals filed for this visit.  Gen:  Appears stated age and in NAD. HEENT:  Normocephalic, atraumatic. The mucous membranes are moist. The superficial temporal arteries are without ropiness or tenderness. Cardiovascular: brady.  regular Lungs: Clear to auscultation bilaterally. Neck: There are no carotid bruits noted bilaterally.  NEUROLOGICAL:  Orientation:  The patient is alert and oriented x 3.   Cranial nerves: There is good facial symmetry. Extraocular muscles are intact and visual fields are full to confrontational testing. Speech is fluent and clear. Soft palate rises symmetrically and there is no tongue deviation. Hearing is intact to conversational tone. Tone: Tone is good throughout. Sensation: Sensation is intact to light touch touch throughout (facial, trunk, extremities). Vibration is intact at the bilateral big toe.  There is no extinction with double simultaneous stimulation. There is no sensory dermatomal level identified. Coordination:  The patient has no dysdiadichokinesia or dysmetria. Motor: Strength is at least 4/5 in the right upper extremity.  There are some giveaway weakness that does improve with  encouragement.  She has some pain in the right arm because of prior shoulder surgery.  Strength is at least  5-/5 in the left upper and bilateral lower extremities, with some giveaway weakness that does improve with encouragement.  DTR's: Deep tendon reflexes are 1/4 at the bilateral biceps, triceps, brachioradialis, 0/4 patella and achilles.  Plantar responses are downgoing bilaterally. Gait and Station: The patient pushes off to arise.  She holds the cane in the L hand.  She has RUE rest tremor with virtually no arm swing.    MOVEMENT EXAM: Tremor:  There is bilateral UE rest tremor.  It doesn't really increase with distraction.  There is jaw tremor.  There is min intention tremor.  There is min postural tremor.    I have reviewed and interpreted the following labs independently   Chemistry      Component Value Date/Time   NA 137 07/24/2022 1110   K 3.6 07/24/2022 1110   CL 107 07/24/2022 1110   CO2 23 07/24/2022 1110   BUN 15 07/24/2022 1110   CREATININE 0.80 07/24/2022 1110      Component Value Date/Time   CALCIUM 9.2 07/24/2022 1110      Lab Results  Component Value Date   WBC 6.7 07/24/2022   HGB 14.6 07/24/2022   HCT 44.9 07/24/2022   MCV 93.7 07/24/2022   PLT 258 07/24/2022   No results found for: TSH    Total time spent on today's visit was ***minutes, including both face-to-face time and nonface-to-face time.  Time included that spent on review of records (prior notes available to me/labs/imaging if pertinent), discussing treatment and goals, answering patient's questions and coordinating care.  CC:  Arloa Elsie SAUNDERS, MD

## 2023-07-07 DIAGNOSIS — G8929 Other chronic pain: Secondary | ICD-10-CM | POA: Diagnosis not present

## 2023-07-07 DIAGNOSIS — R7303 Prediabetes: Secondary | ICD-10-CM | POA: Diagnosis not present

## 2023-07-07 DIAGNOSIS — G20B2 Parkinson's disease with dyskinesia, with fluctuations: Secondary | ICD-10-CM | POA: Diagnosis not present

## 2023-07-07 DIAGNOSIS — Z0000 Encounter for general adult medical examination without abnormal findings: Secondary | ICD-10-CM | POA: Diagnosis not present

## 2023-07-07 DIAGNOSIS — E782 Mixed hyperlipidemia: Secondary | ICD-10-CM | POA: Diagnosis not present

## 2023-07-07 DIAGNOSIS — I10 Essential (primary) hypertension: Secondary | ICD-10-CM | POA: Diagnosis not present

## 2023-07-07 DIAGNOSIS — G43909 Migraine, unspecified, not intractable, without status migrainosus: Secondary | ICD-10-CM | POA: Diagnosis not present

## 2023-07-08 ENCOUNTER — Ambulatory Visit: Payer: Medicare Other | Admitting: Neurology

## 2023-07-08 VITALS — BP 128/82 | HR 60 | Ht 64.0 in | Wt 257.2 lb

## 2023-07-08 DIAGNOSIS — G20C Parkinsonism, unspecified: Secondary | ICD-10-CM

## 2023-07-08 NOTE — Patient Instructions (Signed)
As we discussed, we are going to do a DaT scan.  We discussed that this is not a diagnostic scan, but will just give us some information on dopamine levels in the brain.  Here is some information which may be helpful to you.  Before the Exam  Please tell the nurse, nuclear imaging technician or nuclear medicine physician if you are pregnant, nursing or have reduced liver function. Please also inform us if you have an allergy or sensitivity to iodine.  The test may be completed with those who are allergic to iodine, but may require pre-medication with other medications to help avoid reactions. If you need to cancel the examination, please give us at least 24 hours notice.  Before your scan, stop taking these medicines for the length of time shown: Name of Drug Stop Taking  Amoxapine 4 days before  Benztropine  Cogentin 3 days before  Bupropion (Aplenzin, Budeprion, Voxra, Wellbutrin, Zyban) 48 hours before  Buspirone 15 hours before  Citalopram 48 hours before  Cocaine 6 hours before  Escitalopram 24 hours before  Methamphetamine 24 hours before  Methylphenidate (Concerta, Metadate, Methylin, Ritalin) 20 hours before  Paroxetine 24 hours before  Selegilene 48 hours before  Sertraline 3 days before    On the Day of the Exam Drink plenty of fluids and go to the bathroom frequently (and for two days after your exam) Wear loose comfortable clothing, since you will need to lie still for a period of time. Please bring a list of all medications that you are taking; name and dosage. We want to make your waiting time as pleasant as possible. Consider bringing your favorite magazine, book or music player to help you pass the time.  You do not need to stay at the imaging facility the entire time, between the initial injection and the scan itself.   Please leave your jewelry and valuables at home.  During the Exam The DaTscan once started takes approximately 30-45 minutes. However, following  injection of the DaT agent approximately 3-6 hours are required before the agent has achieved appropriate concentration in the brain.  We will inject the DaTscan through an intravenous (IV) line into your arm in the AM, usually around 8-9am, and then you will come back usually in the mid afternoon for the scan. Before the exam, you will receive a drug to allow you to protect the thyroid. For the imaging test, you will be asked to lie on a table and an imaging technologist will position your head in a headrest. A strip of tape or a flexible restraint may be placed around your head to help you to not move your head during the scan. A camera will be positioned above you and you must remain very still for about 30 minute while images are taken. The scanner will be very close to your head, but will not touch your head.  

## 2023-07-09 DIAGNOSIS — M25511 Pain in right shoulder: Secondary | ICD-10-CM | POA: Diagnosis not present

## 2023-07-09 DIAGNOSIS — R531 Weakness: Secondary | ICD-10-CM | POA: Diagnosis not present

## 2023-07-09 DIAGNOSIS — M25611 Stiffness of right shoulder, not elsewhere classified: Secondary | ICD-10-CM | POA: Diagnosis not present

## 2023-07-12 ENCOUNTER — Ambulatory Visit
Admission: RE | Admit: 2023-07-12 | Discharge: 2023-07-12 | Disposition: A | Discharge status: Home / Self Care | Source: Ambulatory Visit | Attending: Family Medicine | Admitting: Family Medicine

## 2023-07-12 DIAGNOSIS — Z1231 Encounter for screening mammogram for malignant neoplasm of breast: Secondary | ICD-10-CM | POA: Diagnosis not present

## 2023-07-14 DIAGNOSIS — M25611 Stiffness of right shoulder, not elsewhere classified: Secondary | ICD-10-CM | POA: Diagnosis not present

## 2023-07-14 DIAGNOSIS — R531 Weakness: Secondary | ICD-10-CM | POA: Diagnosis not present

## 2023-07-14 DIAGNOSIS — M25511 Pain in right shoulder: Secondary | ICD-10-CM | POA: Diagnosis not present

## 2023-07-19 DIAGNOSIS — R531 Weakness: Secondary | ICD-10-CM | POA: Diagnosis not present

## 2023-07-19 DIAGNOSIS — M25611 Stiffness of right shoulder, not elsewhere classified: Secondary | ICD-10-CM | POA: Diagnosis not present

## 2023-07-19 DIAGNOSIS — M25511 Pain in right shoulder: Secondary | ICD-10-CM | POA: Diagnosis not present

## 2023-07-21 DIAGNOSIS — M25611 Stiffness of right shoulder, not elsewhere classified: Secondary | ICD-10-CM | POA: Diagnosis not present

## 2023-07-21 DIAGNOSIS — R531 Weakness: Secondary | ICD-10-CM | POA: Diagnosis not present

## 2023-07-21 DIAGNOSIS — M25511 Pain in right shoulder: Secondary | ICD-10-CM | POA: Diagnosis not present

## 2023-07-26 DIAGNOSIS — M25511 Pain in right shoulder: Secondary | ICD-10-CM | POA: Diagnosis not present

## 2023-07-26 DIAGNOSIS — M25611 Stiffness of right shoulder, not elsewhere classified: Secondary | ICD-10-CM | POA: Diagnosis not present

## 2023-07-26 DIAGNOSIS — R531 Weakness: Secondary | ICD-10-CM | POA: Diagnosis not present

## 2023-07-27 DIAGNOSIS — M47816 Spondylosis without myelopathy or radiculopathy, lumbar region: Secondary | ICD-10-CM | POA: Diagnosis not present

## 2023-07-28 DIAGNOSIS — M25511 Pain in right shoulder: Secondary | ICD-10-CM | POA: Diagnosis not present

## 2023-07-28 DIAGNOSIS — M25611 Stiffness of right shoulder, not elsewhere classified: Secondary | ICD-10-CM | POA: Diagnosis not present

## 2023-07-28 DIAGNOSIS — R531 Weakness: Secondary | ICD-10-CM | POA: Diagnosis not present

## 2023-07-29 DIAGNOSIS — H35361 Drusen (degenerative) of macula, right eye: Secondary | ICD-10-CM | POA: Diagnosis not present

## 2023-07-29 DIAGNOSIS — H524 Presbyopia: Secondary | ICD-10-CM | POA: Diagnosis not present

## 2023-07-29 DIAGNOSIS — H40013 Open angle with borderline findings, low risk, bilateral: Secondary | ICD-10-CM | POA: Diagnosis not present

## 2023-07-29 DIAGNOSIS — H25813 Combined forms of age-related cataract, bilateral: Secondary | ICD-10-CM | POA: Diagnosis not present

## 2023-07-29 DIAGNOSIS — R7309 Other abnormal glucose: Secondary | ICD-10-CM | POA: Diagnosis not present

## 2023-08-02 DIAGNOSIS — R531 Weakness: Secondary | ICD-10-CM | POA: Diagnosis not present

## 2023-08-02 DIAGNOSIS — M25611 Stiffness of right shoulder, not elsewhere classified: Secondary | ICD-10-CM | POA: Diagnosis not present

## 2023-08-02 DIAGNOSIS — M25511 Pain in right shoulder: Secondary | ICD-10-CM | POA: Diagnosis not present

## 2023-08-03 DIAGNOSIS — M47816 Spondylosis without myelopathy or radiculopathy, lumbar region: Secondary | ICD-10-CM | POA: Diagnosis not present

## 2023-08-04 DIAGNOSIS — R531 Weakness: Secondary | ICD-10-CM | POA: Diagnosis not present

## 2023-08-04 DIAGNOSIS — M25511 Pain in right shoulder: Secondary | ICD-10-CM | POA: Diagnosis not present

## 2023-08-04 DIAGNOSIS — M25611 Stiffness of right shoulder, not elsewhere classified: Secondary | ICD-10-CM | POA: Diagnosis not present

## 2023-08-05 ENCOUNTER — Other Ambulatory Visit: Payer: Self-pay | Admitting: Neurology

## 2023-08-11 ENCOUNTER — Other Ambulatory Visit: Payer: Self-pay

## 2023-08-11 ENCOUNTER — Telehealth: Payer: Self-pay | Admitting: Neurology

## 2023-08-11 DIAGNOSIS — R251 Tremor, unspecified: Secondary | ICD-10-CM

## 2023-08-11 NOTE — Telephone Encounter (Signed)
 Patient is scheduled

## 2023-08-11 NOTE — Telephone Encounter (Signed)
 Pt. Says she was approved by insurance for Datscan but never rcvd call for sched, not showing scan ordered. Please call back with update

## 2023-08-12 DIAGNOSIS — I10 Essential (primary) hypertension: Secondary | ICD-10-CM | POA: Diagnosis not present

## 2023-08-12 DIAGNOSIS — E782 Mixed hyperlipidemia: Secondary | ICD-10-CM | POA: Diagnosis not present

## 2023-08-27 ENCOUNTER — Encounter (HOSPITAL_COMMUNITY): Payer: Self-pay

## 2023-08-27 ENCOUNTER — Ambulatory Visit: Payer: Self-pay | Admitting: Neurology

## 2023-08-27 ENCOUNTER — Ambulatory Visit (HOSPITAL_COMMUNITY)
Admission: RE | Admit: 2023-08-27 | Discharge: 2023-08-27 | Disposition: A | Discharge status: Home / Self Care | Source: Ambulatory Visit | Attending: Neurology | Admitting: Neurology

## 2023-08-27 DIAGNOSIS — R251 Tremor, unspecified: Secondary | ICD-10-CM | POA: Diagnosis not present

## 2023-08-27 MED ORDER — POTASSIUM IODIDE (ANTIDOTE) 130 MG PO TABS
ORAL_TABLET | ORAL | Status: AC
  Filled 2023-08-27: qty 1

## 2023-08-27 MED ORDER — IOFLUPANE I 123 185 MBQ/2.5ML IV SOLN
4.8000 | Freq: Once | INTRAVENOUS | Status: AC | PRN
  Administered 2023-08-27: 4.8 via INTRAVENOUS
  Filled 2023-08-27: qty 5

## 2023-08-30 DIAGNOSIS — M1712 Unilateral primary osteoarthritis, left knee: Secondary | ICD-10-CM | POA: Diagnosis not present

## 2023-08-30 DIAGNOSIS — M65312 Trigger thumb, left thumb: Secondary | ICD-10-CM | POA: Diagnosis not present

## 2023-08-31 DIAGNOSIS — M47816 Spondylosis without myelopathy or radiculopathy, lumbar region: Secondary | ICD-10-CM | POA: Diagnosis not present

## 2023-09-12 DIAGNOSIS — E782 Mixed hyperlipidemia: Secondary | ICD-10-CM | POA: Diagnosis not present

## 2023-09-12 DIAGNOSIS — I10 Essential (primary) hypertension: Secondary | ICD-10-CM | POA: Diagnosis not present

## 2023-10-08 DIAGNOSIS — M47816 Spondylosis without myelopathy or radiculopathy, lumbar region: Secondary | ICD-10-CM | POA: Diagnosis not present

## 2023-10-08 DIAGNOSIS — M797 Fibromyalgia: Secondary | ICD-10-CM | POA: Diagnosis not present

## 2023-10-08 IMAGING — MR MR ELBOW*L* W/O CM
5 series · 37 of 40 positions shown · non-contrast
Comparison: None.

CLINICAL DATA: Left elbow pain posteriorly since June 2020.

EXAM:
MRI OF THE LEFT ELBOW WITHOUT CONTRAST
TECHNIQUE: Multiplanar, multisequence MR imaging of the elbow was performed. No
intravenous contrast was administered.

[Series 9: T2 fat-sat · oblique · left · 3.0mm · 0.22mm/px · 6 of 21 slices shown (1 of 3)]
[im 1/21]
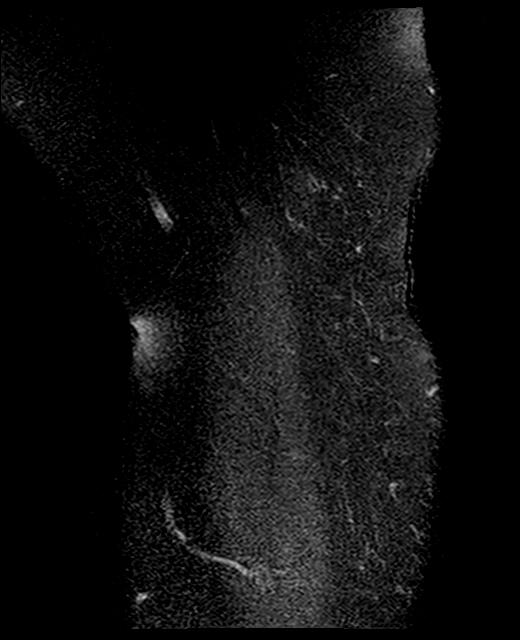
[im 5/21]
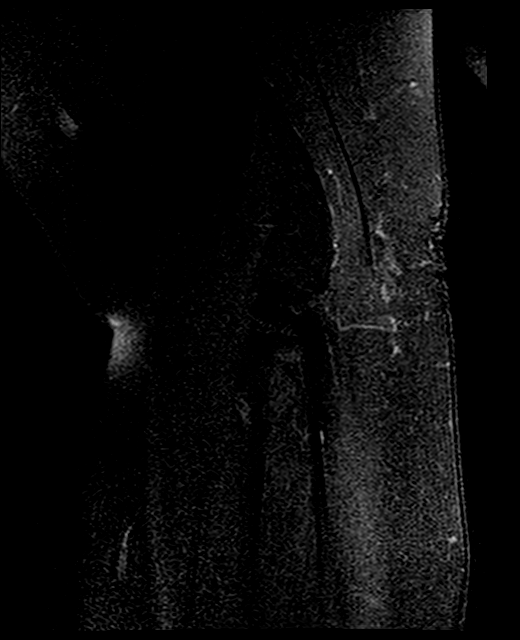
[im 9/21]
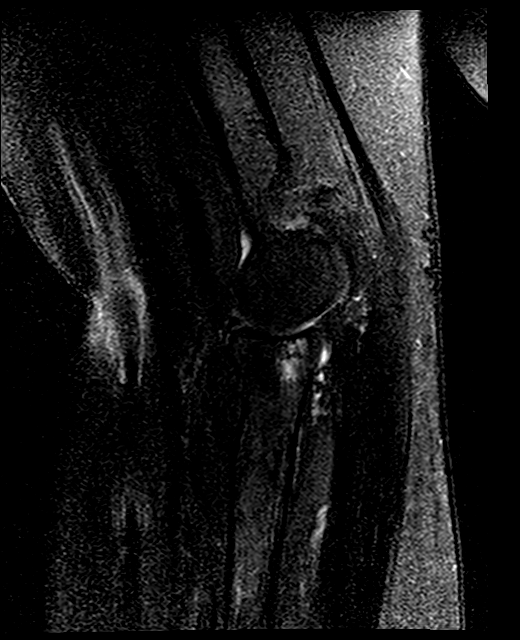
[im 13/21]
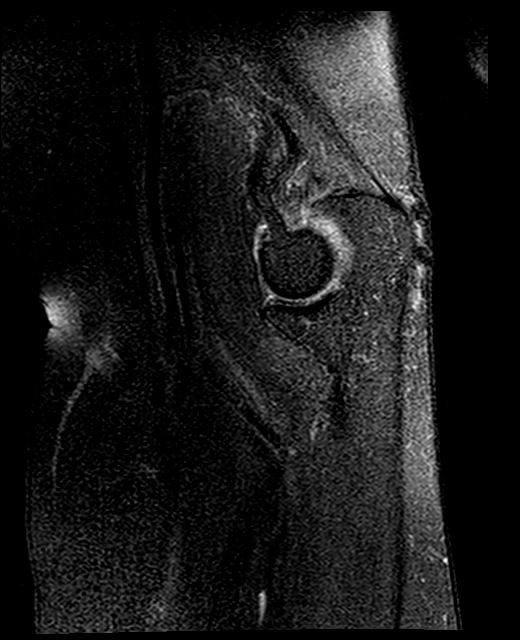
[im 17/21]
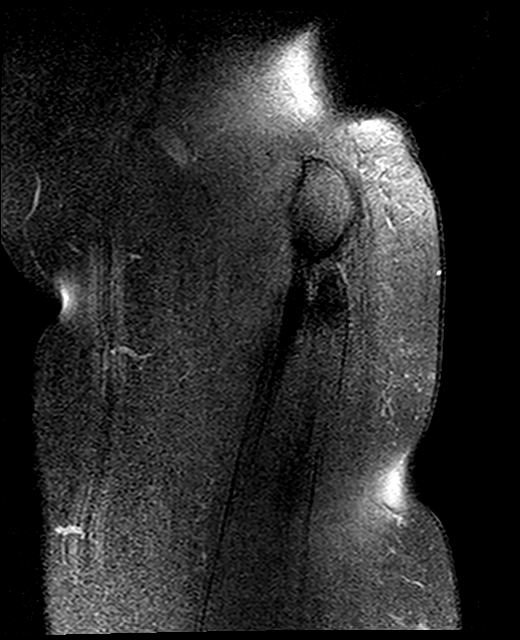
[im 21/21]
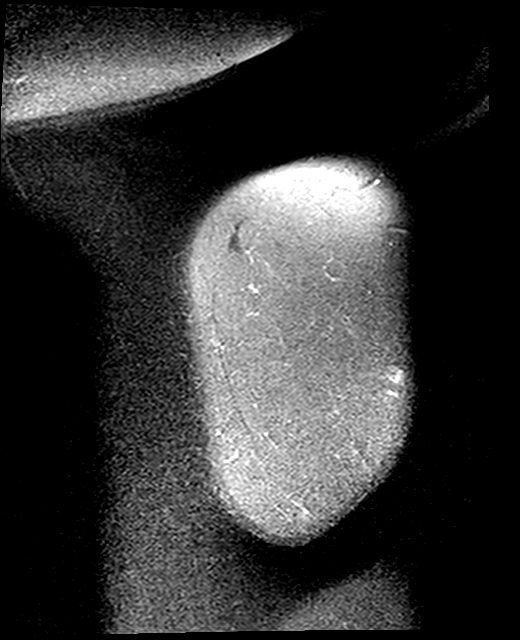

[Series 10: T1 · sagittal · left · 3.5mm · 0.44mm/px · 6 of 20 slices shown (1 of 2)]
[im 1/20]
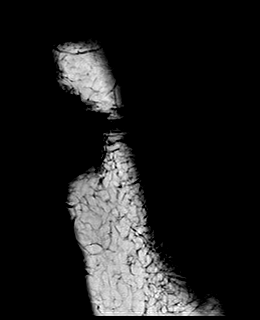
[im 4/20]
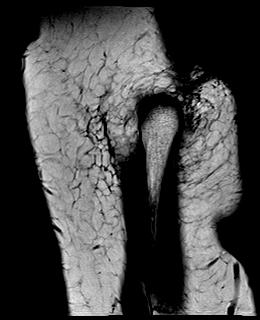
[im 8/20]
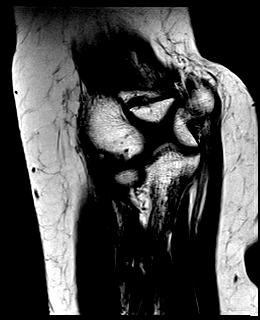
[im 12/20]
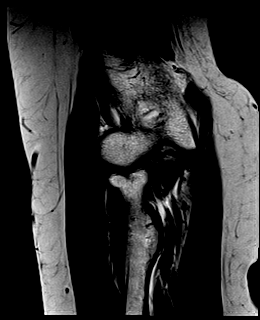
[im 16/20]
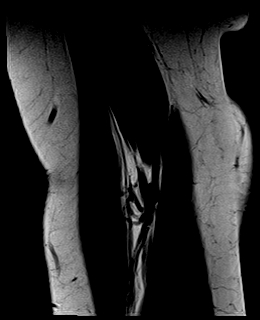
[im 20/20]
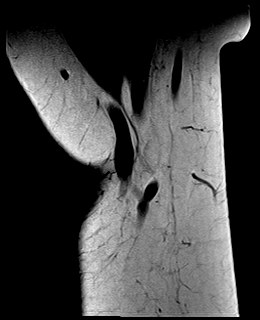

[Series 11: T2 fat-sat · sagittal · left · 3.5mm · 0.44mm/px · 6 of 20 slices shown (2 of 3)]
[im 1/20]
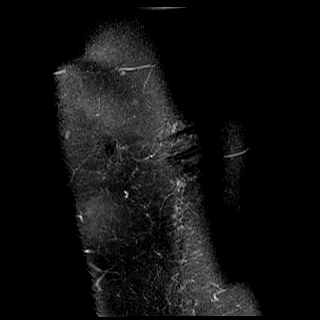
[im 4/20]
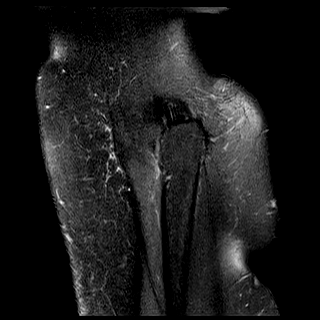
[im 8/20]
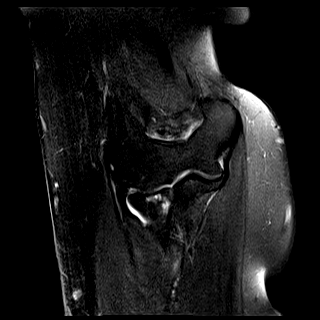
[im 12/20]
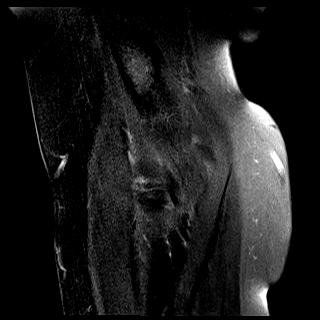
[im 16/20]
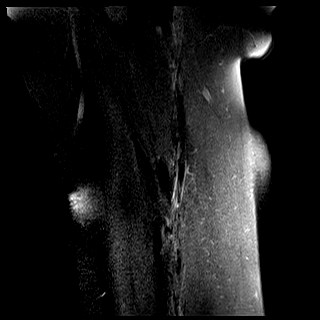
[im 20/20]
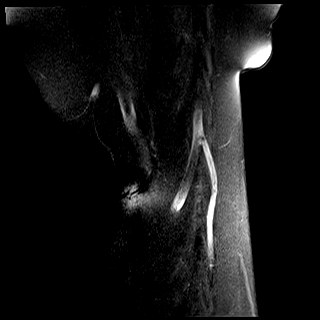

[Series 102: T1 · axial · left · 3.0mm · 0.44mm/px · z∈[-6,+102]mm · 8 of 34 slices shown (2 of 2)]
[im 1/34]
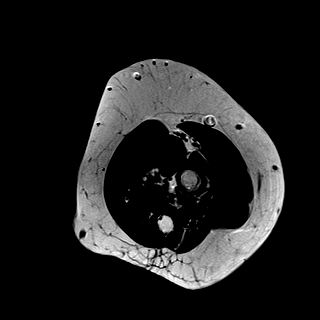
[im 7/34]
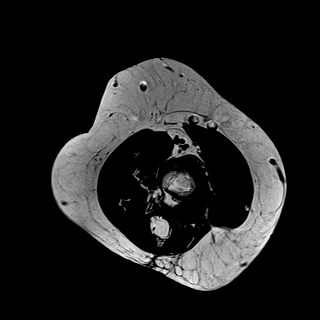
[im 10/34]
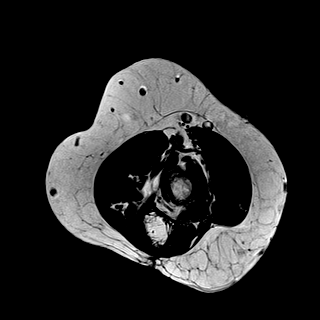
[im 14/34]
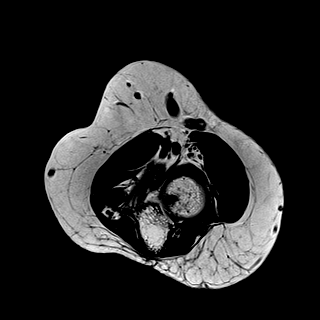
[im 20/34]
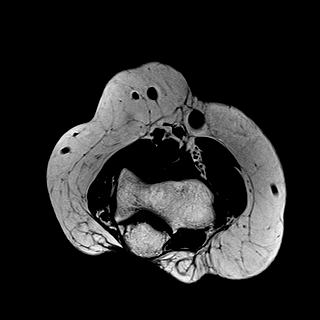
[im 24/34]
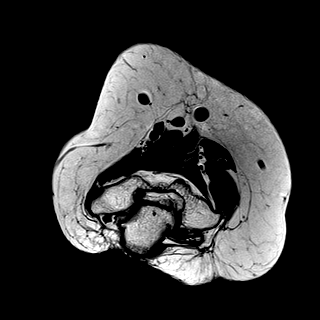
[im 27/34]
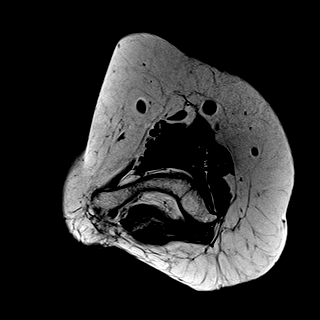
[im 34/34]
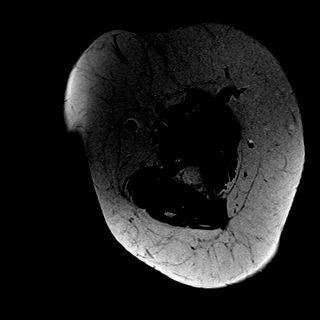

[Series 103: T2 fat-sat · axial · left · 3.0mm · 0.44mm/px · z∈[-5,+103]mm · 11 of 34 slices shown (3 of 3)]
[im 1/34]
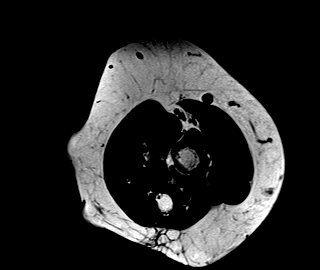
[im 4/34]
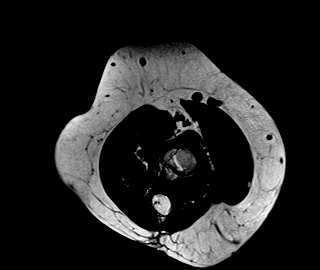
[im 7/34]
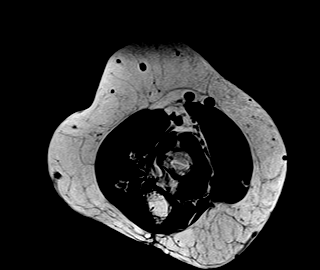
[im 10/34]
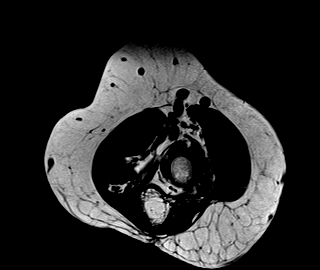
[im 14/34]
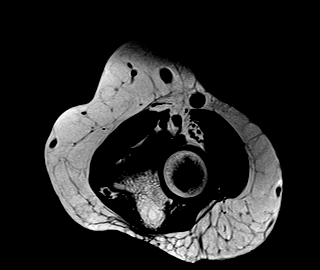
[im 17/34]
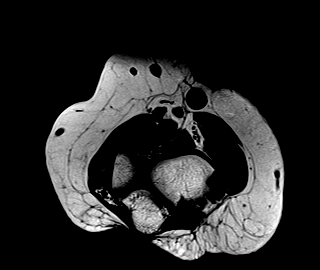
[im 20/34]
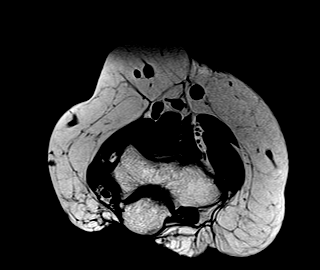
[im 24/34]
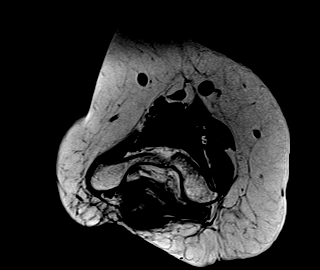
[im 27/34]
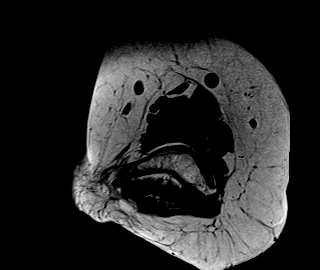
[im 30/34]
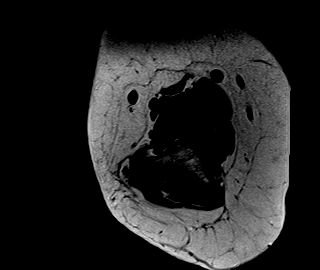
[im 34/34]
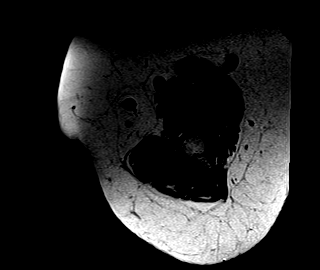

[37 of 40 positions shown; findings below may reference images not displayed]

FINDINGS: TENDONS

Common forearm flexor origin: Intact

Common forearm extensor origin: Tiny interstitial tear of the origin
of the common extensor tendon.

Biceps: Intact

Triceps: Intact

LIGAMENTS

Medial stabilizers: Intact

Lateral stabilizers:  Intact

Cartilage: Partial-thickness cartilage loss of the radiocapitellar
joint with subchondral reactive marrow changes in the radial head.

Joint: No joint effusion. No synovitis.

Cubital tunnel: Normal.

Bones: No fracture or dislocation. No marrow abnormality.

Soft Tissues: Muscles are normal. No fluid collection or hematoma.
IMPRESSION: 1. Partial-thickness cartilage loss of the radiocapitellar joint
with subchondral reactive marrow changes in the radial head.
2. Tiny interstitial tear of the origin of the common extensor
tendon.

## 2023-10-12 DIAGNOSIS — I10 Essential (primary) hypertension: Secondary | ICD-10-CM | POA: Diagnosis not present

## 2023-10-12 DIAGNOSIS — G20B2 Parkinson's disease with dyskinesia, with fluctuations: Secondary | ICD-10-CM | POA: Diagnosis not present

## 2023-10-12 DIAGNOSIS — M797 Fibromyalgia: Secondary | ICD-10-CM | POA: Diagnosis not present

## 2023-10-12 DIAGNOSIS — E782 Mixed hyperlipidemia: Secondary | ICD-10-CM | POA: Diagnosis not present

## 2023-10-14 DIAGNOSIS — M25562 Pain in left knee: Secondary | ICD-10-CM | POA: Diagnosis not present

## 2023-11-30 ENCOUNTER — Ambulatory Visit (INDEPENDENT_AMBULATORY_CARE_PROVIDER_SITE_OTHER): Admitting: Psychology

## 2023-11-30 DIAGNOSIS — F3289 Other specified depressive episodes: Secondary | ICD-10-CM | POA: Diagnosis not present
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
## 2023-12-20 ENCOUNTER — Encounter: Payer: Self-pay | Admitting: Psychology

## 2023-12-20 ENCOUNTER — Ambulatory Visit: Admitting: Psychology

## 2023-12-20 DIAGNOSIS — F3289 Other specified depressive episodes: Secondary | ICD-10-CM

## 2023-12-20 NOTE — Progress Notes (Signed)
 Behavioral Health Treatment Plan   Name:Alicia Klein  MRN: [id number]   Treatment Plan Development Date: 12/20/23   Strengths: Family, Friends, Hopefulness, Self Advocate, and Able to Communicate Effectively  Supports: Friends   Theatre Manager of Needs: Alicia Klein would like to work on get in a better head space, build comfort with leaving the home, verbalize thoughts and feelings, engage in enjoyable activities, engage in self-talk, manage her symptoms proactively, process loss of ability, identify long-term goals for feeling a sense or productivity.     Treatment Level: Individual Therapy and Psychiatric Treatment  Client Treatment Preferences: Outpatient Therapy.    Diagnosis Depressive Disorders  Other depression   Symptoms:  Depressed mood-indicated by subjective report or observation by others (in children and adolescents, can be irritable mood)., Loss of interest or pleasure in almost all activities-indicated by subjective report or observation by others., Significant (more than 5 percent in a month) unintentional weight loss/gain or decrease/increase in appetite (in children, failure to make expected weight gains)., Sleep disturbance (insomnia or hypersomnia)., Tiredness, fatigue, or low energy, or decreased efficiency with which routine tasks are completed., A sense of worthlessness or excessive, inappropriate, or delusional guilt (not merely self-reproach or guilt about being sick)., Impaired ability to think, concentrate, or make decisions-indicated by subjective report or observation by others., The symptoms cause clinically significant distress or impairment in social, occupational, or other important areas of functioning., The symptoms are not due to the direct physiological effects of a substance (e.g., drug abuse, a prescribed medication's side effects) or a medical condition (e.g., hypothyroidism)., The symptoms do not meet criteria for a mixed episode, There has  never been a manic episode or hypomanic episode., and MDE is not better explained by schizophrenia spectrum or other psychotic disorders.  Goals:  Alleviate depressive symptoms to return to effective functioning., Recognize, accept, and cope with depressed feelings., Develop healthy thinking and beliefs about self, others, and the world to alleviate and prevent relapse., and Establish healthy relationships that alleviate and prevent relapse.  Objectives: Target Date For All Objectives: 12/19/24  Identify and replace thoughts and beliefs that support depression., Learn and implement strategies to overcome depression., Learn and implement relapse prevention skills., Implement mindfulness for relapse prevention., Verbalize an understanding of healthy and unhealthy emotions and increase the use of healthy emotions., and Verbalize insight into how past relationships may be influencing current experiences with depression.  Progress Documentation:  Progressing  Interventions:  Cognitive Behavioral Therapy, Mindfulness Meditation, Psycho-education/Bibliotherapy, and Interpersonal   Expected duration of treatment: Evaluate after 1 year of treatment  Party responsible for implementation of interventions: The patient, Alicia Klein and Therapist, [name], LCSW.  This plan has been reviewed and created by the following participants: The patient, Alicia Klein and Therapist, [name], LCSW.  This plan will be reviewed at least every 12 months.  Status of Treatment Plan Signature:  No, pending signature via MyChart.  Signature: [name], LCSW

## 2023-12-20 NOTE — Progress Notes (Signed)
 [facility] Counselor/Therapist Progress Note  Patient ID: COLISHA REDLER, MRN: [id number]   Date: 12/20/23  Time Spent: 4:23  pm - 5:06 pm : 43 Minutes  Treatment Type: Individual Therapy.  Reported Symptoms: depression  Mental Status Exam: Appearance:  Casual     Behavior: Appropriate  Motor: Normal  Speech/Language:  Normal Rate  Affect: Congruent  Mood: dysthymic  Thought process: normal  Thought content:   WNL  Sensory/Perceptual disturbances:   WNL  Orientation: oriented to person, place, time/date, and situation  Attention: Good  Concentration: Good  Memory: WNL  Fund of knowledge:  Good  Insight:   Good  Judgment:  Good  Impulse Control: Good   Risk Assessment: Danger to Self:  No Self-injurious Behavior: No Danger to Others: No Duty to Warn:no Physical Aggression / Violence:No  Access to Firearms a concern: No  Gang Involvement:No   Subjective:   [name] participated from home, via video and consented to treatment. Therapist participated from office. I discussed the limitations of evaluation and management by telemedicine and the availability of in person appointments. The patient expressed understanding and agreed to proceed. Jaedyn reviewed the events of the past week.    We reviewed numerous treatment approaches including CBT, BA, Problem Solving, and Solution focused therapy. Psych-education regarding the Gibson's diagnosis of Other depression was provided during the session. We discussed Kebrina Friend Ijcpidnw'd goals treatment goals which include get in a better head space, build comfort with leaving the home, verbalize thoughts and feelings, engage in enjoyable activities, engage in self-talk, manage her symptoms proactively, process loss of ability, identify long-term goals for feeling a sense or productivity.  SABRA [name] provided verbal approval of the treatment plan.   Interventions: Psycho-education & Goal Setting.    Diagnosis:  Other depression  Psychiatric Treatment: Yes , via PCP. See chart.    [name], LCSW

## 2024-01-03 ENCOUNTER — Ambulatory Visit: Admitting: Psychology

## 2024-01-03 DIAGNOSIS — F3289 Other specified depressive episodes: Secondary | ICD-10-CM

## 2024-01-03 NOTE — Progress Notes (Signed)
 Alicia Klein Counselor/Therapist Progress Note   Patient ID: Alicia Klein, MRN: [id number]   Date: 01/03/2024  Time Spent: 11:01 am - 12:05 pm :  64 Minutes  Treatment Type: Individual Therapy.  Reported Symptoms: depression  Mental Status Exam: Appearance:  Casual     Behavior: Appropriate  Motor: Normal  Speech/Language:  Normal Rate  Affect: Congruent  Mood: dysthymic  Thought process: normal  Thought content:   WNL  Sensory/Perceptual disturbances:   WNL  Orientation: oriented to person, place, time/date, and situation  Attention: Good  Concentration: Good  Memory: WNL  Fund of knowledge:  Good  Insight:   Good  Judgment:  Good  Impulse Control: Good   Risk Assessment: Danger to Self:  No Self-injurious Behavior: No Danger to Others: No Duty to Warn:no Physical Aggression / Violence:No  Access to Firearms a concern: No  Gang Involvement:No   Subjective:   Alicia Klein participated from home, via video and consented to treatment. Therapist participated from office. I discussed the limitations of evaluation and management by telemedicine and the availability of in person appointments. The patient expressed understanding and agreed to proceed. Alicia Klein reviewed the events of the past week.  Alicia Klein noted not sleeping well, overall, and noted this being primarily due to pain. She noted additional contributing factors include her cat sleeping in her bed with her. She described her cat as needy. She noted environmental sounds also waking her. We worked on reviewing her coping skills. Alicia Klein noted listening to streaming radio shows & turning on a previously viewed TV show. We worked on identifying other tools or interventions that could aid in improving her sleep. She noted taking her medication as prescribed and that her pain is always present noting that the medication cuts the edge off. She noted her pain level around 8 or 9. She noted people's lack  of understanding regarding her pain level and how pain medication is limited in it's efficacy. She noted very few people understanding her experience and noted that when people can't see a physical disability, they have difficulty understanding the experience. She noted her recent effort to set boundaries with a friend of ~40 years due to his consistent effort to discuss stressful topics despite Alicia Klein's effort to set boundaries. She noted I have always been bad at taking on other peoples' causes and putting mine on the back burner. She noted driving herself crazy and noted this resulting in getting frustrated that they didn't take sound advice. She noted difficulty grieving for her sister Alicia Klein) and noted being pissed by her sister's lack of action and planning for Alicia Klein, her sister's daughter who is neuro divergent and high needs. We worked on exploring and processing this during the will continue to going forward. Therapist validated Alicia Klein's feelings and experience during the session.   .  Interventions: CBT  Diagnosis:  Other depression  Psychiatric Treatment: Yes , via PCP. See chart.    Alicia Mullet, LCSW     Behavioral Klein Treatment Plan     Name:Alicia Klein   MRN: [id number]     Treatment Plan Development Date: 12/20/23     Strengths: Family, Friends, Hopefulness, Self Advocate, and Able to Communicate Effectively   Supports: Friends     Theatre Manager of Needs: Alicia Klein would like to work on get in a better head space, build comfort with leaving the home, verbalize thoughts and feelings, engage in enjoyable activities, engage in self-talk, manage her symptoms proactively, process loss  of ability, identify long-term goals for feeling a sense or productivity.       Treatment Level: Individual Therapy and Psychiatric Treatment   Client Treatment Preferences: Outpatient Therapy.      Diagnosis Depressive Disorders   Other depression     Symptoms:   Depressed mood-indicated by subjective report or observation by others (in children and adolescents, can be irritable mood)., Loss of interest or pleasure in almost all activities-indicated by subjective report or observation by others., Significant (more than 5 percent in a month) unintentional weight loss/gain or decrease/increase in appetite (in children, failure to make expected weight gains)., Sleep disturbance (insomnia or hypersomnia)., Tiredness, fatigue, or low energy, or decreased efficiency with which routine tasks are completed., A sense of worthlessness or excessive, inappropriate, or delusional guilt (not merely self-reproach or guilt about being sick)., Impaired ability to think, concentrate, or make decisions-indicated by subjective report or observation by others., The symptoms cause clinically significant distress or impairment in social, occupational, or other important areas of functioning., The symptoms are not due to the direct physiological effects of a substance (e.g., drug abuse, a prescribed medication's side effects) or a medical condition (e.g., hypothyroidism)., The symptoms do not meet criteria for a mixed episode, There has never been a manic episode or hypomanic episode., and MDE is not better explained by schizophrenia spectrum or other psychotic disorders.   Goals:   Alleviate depressive symptoms to return to effective functioning., Recognize, accept, and cope with depressed feelings., Develop healthy thinking and beliefs about self, others, and the world to alleviate and prevent relapse., and Establish healthy relationships that alleviate and prevent relapse.   Objectives: Target Date For All Objectives: 12/19/24   Identify and replace thoughts and beliefs that support depression., Learn and implement strategies to overcome depression., Learn and implement relapse prevention skills., Implement mindfulness for relapse prevention., Verbalize an understanding of  healthy and unhealthy emotions and increase the use of healthy emotions., and Verbalize insight into how past relationships may be influencing current experiences with depression.   Progress Documentation:   Progressing   Interventions:   Cognitive Behavioral Therapy, Mindfulness Meditation, Psycho-education/Bibliotherapy, and Interpersonal     Expected duration of treatment: Evaluate after 1 year of treatment   Party responsible for implementation of interventions: The patient, JANYTH RIERA and Therapist, Alicia Mullet, LCSW.   This plan has been reviewed and created by the following participants: The patient, DONICE ALPERIN and Therapist, Alicia Mullet, LCSW.   This plan will be reviewed at least every 12 months.   Status of Treatment Plan Signature:  No, pending signature via MyChart.   Signature:  Alicia Mullet, LCSW

## 2024-01-03 NOTE — Progress Notes (Signed)
 Behavioral Health Treatment Plan   Name:Alicia Klein  MRN: [id number]   Treatment Plan Development Date: 12/20/23   Strengths: Family, Friends, Hopefulness, Self Advocate, and Able to Communicate Effectively  Supports: Friends   Theatre Manager of Needs: Alicia Klein would like to work on get in a better head space, build comfort with leaving the home, verbalize thoughts and feelings, engage in enjoyable activities, engage in self-talk, manage her symptoms proactively, process loss of ability, identify long-term goals for feeling a sense or productivity.     Treatment Level: Individual Therapy and Psychiatric Treatment  Client Treatment Preferences: Outpatient Therapy.    Diagnosis Depressive Disorders  No diagnosis found.   Symptoms:  Depressed mood-indicated by subjective report or observation by others (in children and adolescents, can be irritable mood)., Loss of interest or pleasure in almost all activities-indicated by subjective report or observation by others., Significant (more than 5 percent in a month) unintentional weight loss/gain or decrease/increase in appetite (in children, failure to make expected weight gains)., Sleep disturbance (insomnia or hypersomnia)., Tiredness, fatigue, or low energy, or decreased efficiency with which routine tasks are completed., A sense of worthlessness or excessive, inappropriate, or delusional guilt (not merely self-reproach or guilt about being sick)., Impaired ability to think, concentrate, or make decisions-indicated by subjective report or observation by others., The symptoms cause clinically significant distress or impairment in social, occupational, or other important areas of functioning., The symptoms are not due to the direct physiological effects of a substance (e.g., drug abuse, a prescribed medication's side effects) or a medical condition (e.g., hypothyroidism)., The symptoms do not meet criteria for a mixed episode, There has  never been a manic episode or hypomanic episode., and MDE is not better explained by schizophrenia spectrum or other psychotic disorders.  Goals:  Alleviate depressive symptoms to return to effective functioning., Recognize, accept, and cope with depressed feelings., Develop healthy thinking and beliefs about self, others, and the world to alleviate and prevent relapse., and Establish healthy relationships that alleviate and prevent relapse.  Objectives: Target Date For All Objectives: 12/19/24  Identify and replace thoughts and beliefs that support depression., Learn and implement strategies to overcome depression., Learn and implement relapse prevention skills., Implement mindfulness for relapse prevention., Verbalize an understanding of healthy and unhealthy emotions and increase the use of healthy emotions., and Verbalize insight into how past relationships may be influencing current experiences with depression.  Progress Documentation:  Progressing  Interventions:  Cognitive Behavioral Therapy, Mindfulness Meditation, Psycho-education/Bibliotherapy, and Interpersonal   Expected duration of treatment: Evaluate after 1 year of treatment  Party responsible for implementation of interventions: The patient, Alicia Klein and Therapist, [name], LCSW.  This plan has been reviewed and created by the following participants: The patient, Alicia Klein and Therapist, [name], LCSW.  This plan will be reviewed at least every 12 months.  Status of Treatment Plan Signature:  No, pending signature via MyChart.  Signature: [name], LCSW

## 2024-01-24 ENCOUNTER — Ambulatory Visit: Admitting: Psychology

## 2024-01-24 DIAGNOSIS — F3289 Other specified depressive episodes: Secondary | ICD-10-CM | POA: Diagnosis not present

## 2024-01-24 NOTE — Progress Notes (Unsigned)
 [facility] Counselor/Therapist Progress Note   Patient ID: Alicia Klein, MRN: [id number]   Date: 01/24/2024  Time Spent: 3:34 pm - 4:29  pm :  55  Minutes  Treatment Type: Individual Therapy.  Reported Symptoms: depression  Mental Status Exam: Appearance:  Casual     Behavior: Appropriate  Motor: Normal  Speech/Language:  Normal Rate  Affect: Congruent  Mood: dysthymic  Thought process: normal  Thought content:   WNL  Sensory/Perceptual disturbances:   WNL  Orientation: oriented to person, place, time/date, and situation  Attention: Good  Concentration: Good  Memory: WNL  Fund of knowledge:  Good  Insight:   Good  Judgment:  Good  Impulse Control: Good   Risk Assessment: Danger to Self:  No Self-injurious Behavior: No Danger to Others: No Duty to Warn:no Physical Aggression / Violence:No  Access to Firearms a concern: No  Gang Involvement:No   Subjective:   Alicia Klein participated from home, via video and consented to treatment. Therapist participated from office. [name] reviewed the events of the past week.  [name] noted enjoyed her holiday season, overall, and noted this being low key. [name] noted periodically going through pissy moods. She noted wondering if this was hormonal. She noted when she is feeling irritable nothing set me off. She noted the duration of this mood fluctuating. Her typical coping is to watch a humorous video to get me out of that feeling. She noted sometimes I wake up with that feeling. We worked on identifying possible contributing factors and [name] had a difficult time pinpointing the cause of this mood. She noted often letting others know to maintain distance. She noted the possibility of the current political atmosphere, her cat being needy, recent shooting in [place] , current pain level, weather, migraines, and frustration regarding her health. She noted that I can't win because there isn't a day that  I don't have something going on.  She noted the possibility of various factors playing a part in her mood. She noted stress related to asking her daughter for help including picking up her medication from the pharmacy. We worked on exploring this and [name] feeling like a burden despite the feedback from her daughter that she is glad to help her. Therapist highlighted [name]'s emotional reasoning during the session. Therapist provided psycho-education regarding cognitive distortions including emotional reasoning. She noted the possibility that he daughter's lack of drive towards self-care and health could play a part in her perception. Therapist modeled how to challenge emotional reasoning and cognitive distortion and encouraged mindfulness and effort in this area going forward. Nandi was engaged and motivated during the session. She expressed commitment towards goals. Therapist praised [name] for her effort and energy during the session and provided supportive therapy. A follow-up was scheduled for continued treatment,which she benefits from.   Interventions: CBT  Diagnosis:  Other depression  Psychiatric Treatment: Yes , via PCP. See chart.    [name], LCSW     Behavioral Health Treatment Plan     Name:Alicia Klein   MRN: [id number]     Treatment Plan Development Date: 12/20/23     Strengths: Family, Friends, Hopefulness, Self Advocate, and Able to Communicate Effectively   Supports: Friends     Theatre Manager of Needs: Chrystine would like to work on get in a better head space, build comfort with leaving the home, verbalize thoughts and feelings, engage in enjoyable activities, engage in self-talk, manage her symptoms proactively, process loss of ability, identify long-term  goals for feeling a sense or productivity.       Treatment Level: Individual Therapy and Psychiatric Treatment   Client Treatment Preferences: Outpatient Therapy.      Diagnosis Depressive  Disorders   Other depression    Symptoms:   Depressed mood-indicated by subjective report or observation by others (in children and adolescents, can be irritable mood)., Loss of interest or pleasure in almost all activities-indicated by subjective report or observation by others., Significant (more than 5 percent in a month) unintentional weight loss/gain or decrease/increase in appetite (in children, failure to make expected weight gains)., Sleep disturbance (insomnia or hypersomnia)., Tiredness, fatigue, or low energy, or decreased efficiency with which routine tasks are completed., A sense of worthlessness or excessive, inappropriate, or delusional guilt (not merely self-reproach or guilt about being sick)., Impaired ability to think, concentrate, or make decisions-indicated by subjective report or observation by others., The symptoms cause clinically significant distress or impairment in social, occupational, or other important areas of functioning., The symptoms are not due to the direct physiological effects of a substance (e.g., drug abuse, a prescribed medication's side effects) or a medical condition (e.g., hypothyroidism)., The symptoms do not meet criteria for a mixed episode, There has never been a manic episode or hypomanic episode., and MDE is not better explained by schizophrenia spectrum or other psychotic disorders.   Goals:   Alleviate depressive symptoms to return to effective functioning., Recognize, accept, and cope with depressed feelings., Develop healthy thinking and beliefs about self, others, and the world to alleviate and prevent relapse., and Establish healthy relationships that alleviate and prevent relapse.   Objectives: Target Date For All Objectives: 12/19/24   Identify and replace thoughts and beliefs that support depression., Learn and implement strategies to overcome depression., Learn and implement relapse prevention skills., Implement mindfulness for relapse  prevention., Verbalize an understanding of healthy and unhealthy emotions and increase the use of healthy emotions., and Verbalize insight into how past relationships may be influencing current experiences with depression.   Progress Documentation:   Progressing   Interventions:   Cognitive Behavioral Therapy, Mindfulness Meditation, Psycho-education/Bibliotherapy, and Interpersonal     Expected duration of treatment: Evaluate after 1 year of treatment   Party responsible for implementation of interventions: The patient, SINIYAH EVANGELIST and Therapist, [name], LCSW.   This plan has been reviewed and created by the following participants: The patient, GALA PADOVANO and Therapist, [name], LCSW.   This plan will be reviewed at least every 12 months.   Status of Treatment Plan Signature:  No, pending signature via MyChart.   Signature:  [name], LCSW

## 2024-01-25 NOTE — Progress Notes (Unsigned)
 " Assessment/Plan:   1.  Parkinsonism  - This is very unlikely to be Parkinson's disease given normal radiologic and biochemical testing  -unclear if due to hx of metoclopramide exposure and unclear how long on medication (was years ago per pt)  - Skin biopsy for alpha-synuclein March, 2025 was normal.  - DaTscan  negative August, 2025.  -Patient had testing done via Parkinsons Disease GeneRation study that was negative.  Had family history of some type of Parkinsons dystonia syndrome.  - She will continue carbidopa /levodopa  25/100, 1 tablet 3 times per day as she feels it has been helpful thus far.  Her last MRI of the brain was unremarkable, but that was over 10 years ago.  I do not see anything focal or lateralizing on her examination today, but discussed with her whether or not we should repeat that and she doesn't think so  -declines PT  -She was given information to community exercise programs.  - Discussed again that patient and primary care should watch the propranolol given her bradycardia and low blood pressure.  2.  Chronic pain/fibromyalgia  -Patient is following with Dr. Jennette management.  She also sees orthopedics.     Subjective:   Alicia Klein was seen in follow-up today.  She did have a DaTscan  done since last visit and that scan was actually normal.  She is taking levodopa  and overall feels that that medication has been helpful.  She doesn't have as many falls when on the medication but still has tremors, esp with stress or with fatigued. Her skin biopsy has been negative in the past.  She notes that she is clenching her teeth but dentist feels that bite guard not helpful.  Looking back, she may have been on reglan/metoclopramide in the past but it wasn't for long.  She denies being on antipsychotics, remembering that she asked her pcp about abilify in the past but he didn't want her on the medication  Current/Previously tried tremor medications: Propranolol 40 mg  twice per day; on gabapentin for pain management, 600 mg 3 times per day and Topamax for headache, 150 mg at bed; prior metoprolol   Allergies  Allergen Reactions   Sulfa Antibiotics Nausea And Vomiting   Amoxicillin Hives and Rash    Has patient had a PCN reaction causing immediate rash, facial/tongue/throat swelling, SOB or lightheadedness with hypotension: {no Has patient had a PCN reaction causing severe rash involving mucus membranes or skin necrosis: {no Has patient had a PCN reaction that required hospitalization {no Has patient had a PCN reaction occurring within the last 10 years: {no If all of the above answers are NO, then may proceed with Cephalosporin use.   Garlic Other (See Comments)    Causes knots in stomach   Onion Other (See Comments)    Causes knots in stomach   Percocet [Oxycodone-Acetaminophen ] Nausea Only   Wellbutrin [Bupropion]     Emotional reaction/mood changes     Current Meds  Medication Sig   Acetaminophen  (TYLENOL  8 HOUR ARTHRITIS PAIN PO) Take 1,300 mg by mouth 2 (two) times daily as needed (pain.).   aspirin EC 81 MG tablet Take 81 mg by mouth in the morning.   baclofen (LIORESAL) 10 MG tablet Take 10 mg by mouth 2 (two) times daily as needed (breakthrough migraines.). Limited to 2 episodes per week   BIOTIN PO Take 1 tablet by mouth in the morning.   cholecalciferol (VITAMIN D) 1000 UNITS tablet Take 4,000 Units by mouth every evening.  citalopram (CELEXA) 40 MG tablet Take 40 mg by mouth daily.   cyanocobalamin  (VITAMIN B12) 1000 MCG tablet Take 1,000 mcg by mouth in the morning.   famotidine (PEPCID) 20 MG tablet Take 20 mg by mouth 2 (two) times daily as needed for indigestion or heartburn.   furosemide (LASIX) 80 MG tablet Take 80 mg by mouth in the morning.   gabapentin (NEURONTIN) 600 MG tablet Take 600 mg by mouth 3 (three) times daily.   HYDROcodone -acetaminophen  (NORCO/VICODIN) 5-325 MG tablet Take 1-2 tablets by mouth every 6 (six) hours  as needed (pain.).   KLOR-CON M10 10 MEQ tablet Take 10 mEq by mouth in the morning.   Multiple Vitamins-Minerals (MULTIVITAMINS THER. W/MINERALS) TABS Take 1 tablet by mouth in the morning. Women's Senior Multivitamin   Omega-3 Fatty Acids (OMEGA 3 PO) Take 1,000 mg by mouth in the morning.   Polyvinyl Alcohol (LUBRICANT DROPS OP) Place 1-2 drops into both eyes 3 (three) times daily as needed (dry/irritated eyes.).   propranolol (INDERAL) 40 MG tablet Take 40 mg by mouth in the morning and at bedtime.   rosuvastatin (CRESTOR) 5 MG tablet Take 5 mg by mouth every Monday, Wednesday, and Friday. In the morning.   topiramate (TOPAMAX) 50 MG tablet Take 150 mg by mouth at bedtime. Migraines   [DISCONTINUED] carbidopa -levodopa  (SINEMET  IR) 25-100 MG tablet TAKE 1 TABLET BY MOUTH 3 (THREE) TIMES DAILY. 7AM/11AM/4PM    Objective:   VITALS:   Vitals:   01/27/24 1037  BP: 124/78  Pulse: 80  SpO2: 99%  Weight: 246 lb 6.4 oz (111.8 kg)     Gen:  Appears stated age and in NAD.  Affect is flat. HEENT:  Normocephalic, atraumatic. The mucous membranes are moist. The superficial temporal arteries are without ropiness or tenderness. Cardiovascular: RRR Lungs: Clear to auscultation bilaterally. Neck: There are no carotid bruits noted bilaterally.  NEUROLOGICAL:  Orientation:  The patient is alert and oriented x 3.   Cranial nerves: There is good facial symmetry. Extraocular muscles are intact and visual fields are full to confrontational testing. Speech is fluent and clear. Soft palate rises symmetrically and there is no tongue deviation. Hearing is intact to conversational tone. Tone: Tone is good throughout. Sensation: Sensation is intact to light touch touch throughout  Coordination:  The patient has no dysdiadichokinesia or dysmetria.  There is no significant decremation. Motor: Strength is at at least antigravity x 4. Gait and Station: The patient pushes off to arise.  She is somewhat  antalgic MOVEMENT EXAM: Tremor:  There is bilateral UE rest tremor.  It doesn't really increase with distraction.  There is jaw tremor.  This is all stable from prior visits.  I have reviewed and interpreted the following labs independently   Chemistry      Component Value Date/Time   NA 137 07/24/2022 1110   K 3.6 07/24/2022 1110   CL 107 07/24/2022 1110   CO2 23 07/24/2022 1110   BUN 15 07/24/2022 1110   CREATININE 0.80 07/24/2022 1110      Component Value Date/Time   CALCIUM 9.2 07/24/2022 1110      Lab Results  Component Value Date   WBC 6.7 07/24/2022   HGB 14.6 07/24/2022   HCT 44.9 07/24/2022   MCV 93.7 07/24/2022   PLT 258 07/24/2022   No results found for: TSH    Total time spent on today's visit was 30 minutes, including both face-to-face time and nonface-to-face time.  Time included that spent  on review of records (prior notes available to me/labs/imaging if pertinent), discussing treatment and goals, answering patient's questions and coordinating care.  CC:  Arloa Elsie SAUNDERS, MD   "

## 2024-01-27 ENCOUNTER — Ambulatory Visit: Admitting: Neurology

## 2024-01-27 VITALS — BP 124/78 | HR 80 | Wt 246.4 lb

## 2024-01-27 DIAGNOSIS — G20C Parkinsonism, unspecified: Secondary | ICD-10-CM | POA: Diagnosis not present

## 2024-01-27 MED ORDER — CARBIDOPA-LEVODOPA 25-100 MG PO TABS: 1.0000 | ORAL_TABLET | Freq: Three times a day (TID) | ORAL | 1 refills | Status: AC

## 2024-01-27 NOTE — Patient Instructions (Addendum)
" ° °  VISIT SUMMARY: Alicia Klein, a 69 year old female with parkinsonism, visited for a follow-up on her motor symptoms and medication management. She continues to experience tremors, which are worsened by stress and fatigue, but has seen a reduction in near falls since starting carbidopa -levodopa . She remains cautious with ambulation and occasionally experiences episodes of leg uncoordination. She also reported jaw discomfort and teeth clenching, which she discussed with her dentist. She has seen improvement in pain following recent radiofrequency ablation.  YOUR PLAN: -Parkinsonism: parkinsonism is a progressive nervous system disorder that affects movement. Your symptoms, including tremors and jaw clenching, are being managed with carbidopa -levodopa , which has helped reduce your risk of falls. We have refilled your prescription and you should continue your current regimen of taking carbidopa -levodopa  25/100 mg three times daily at approximately 7:00 am, 11:00 am, and 4:00 pm. We will schedule a follow-up appointment in six months to monitor your progress.  INSTRUCTIONS: Please continue taking your carbidopa -levodopa  as prescribed and remain cautious with your movements to avoid falls. We will see you again in six months for a follow-up appointment.                      Contains text generated by [employer].                                 Contains text generated by [employer].                                "

## 2024-02-14 ENCOUNTER — Ambulatory Visit: Admitting: Psychology

## 2024-02-24 ENCOUNTER — Ambulatory Visit: Admitting: Psychology

## 2024-03-07 ENCOUNTER — Ambulatory Visit: Admitting: Psychology

## 2024-08-08 ENCOUNTER — Ambulatory Visit: Payer: Self-pay | Admitting: Neurology
